# Patient Record
Sex: Male | Born: 1945
Health system: Southern US, Community
[De-identification: ages and names within clinical notes are randomized; demographics above are authoritative.]

## PROBLEM LIST (undated history)

## (undated) ENCOUNTER — Emergency Department: Discharge status: Absent without leave | Payer: PPO

## (undated) DIAGNOSIS — E785 Hyperlipidemia, unspecified: Secondary | ICD-10-CM

## (undated) DIAGNOSIS — N400 Enlarged prostate without lower urinary tract symptoms: Secondary | ICD-10-CM

## (undated) DIAGNOSIS — R339 Retention of urine, unspecified: Secondary | ICD-10-CM

## (undated) DIAGNOSIS — E7800 Pure hypercholesterolemia, unspecified: Secondary | ICD-10-CM

## (undated) DIAGNOSIS — C187 Malignant neoplasm of sigmoid colon: Secondary | ICD-10-CM

## (undated) DIAGNOSIS — Z8042 Family history of malignant neoplasm of prostate: Secondary | ICD-10-CM

## (undated) DIAGNOSIS — N3943 Post-void dribbling: Secondary | ICD-10-CM

## (undated) DIAGNOSIS — N401 Enlarged prostate with lower urinary tract symptoms: Secondary | ICD-10-CM

## (undated) DIAGNOSIS — C801 Malignant (primary) neoplasm, unspecified: Secondary | ICD-10-CM

## (undated) DIAGNOSIS — Z8739 Personal history of other diseases of the musculoskeletal system and connective tissue: Secondary | ICD-10-CM

## (undated) DIAGNOSIS — Z85038 Personal history of other malignant neoplasm of large intestine: Secondary | ICD-10-CM

## (undated) HISTORY — DX: Retention of urine, unspecified: R33.9

## (undated) HISTORY — PX: EYE SURGERY: SHX253

## (undated) HISTORY — DX: Malignant neoplasm of sigmoid colon: C18.7

## (undated) HISTORY — DX: Pure hypercholesterolemia, unspecified: E78.00

## (undated) HISTORY — DX: Benign prostatic hyperplasia with lower urinary tract symptoms: N40.1

## (undated) HISTORY — DX: Personal history of other diseases of the musculoskeletal system and connective tissue: Z87.39

## (undated) HISTORY — DX: Post-void dribbling: N39.43

## (undated) HISTORY — DX: Family history of malignant neoplasm of prostate: Z80.42

---

## 1992-10-15 HISTORY — PX: BACK SURGERY: SHX140

## 1994-10-15 HISTORY — PX: BACK SURGERY: SHX140

## 2009-10-15 DIAGNOSIS — C4492 Squamous cell carcinoma of skin, unspecified: Secondary | ICD-10-CM

## 2009-10-15 HISTORY — DX: Squamous cell carcinoma of skin, unspecified: C44.92

## 2012-12-01 ENCOUNTER — Ambulatory Visit: Payer: Self-pay | Admitting: Ophthalmology

## 2013-01-20 ENCOUNTER — Ambulatory Visit: Payer: Self-pay | Admitting: Ophthalmology

## 2013-04-15 DIAGNOSIS — N401 Enlarged prostate with lower urinary tract symptoms: Secondary | ICD-10-CM | POA: Insufficient documentation

## 2013-04-15 DIAGNOSIS — R339 Retention of urine, unspecified: Secondary | ICD-10-CM

## 2013-04-15 DIAGNOSIS — N3943 Post-void dribbling: Secondary | ICD-10-CM

## 2013-04-15 DIAGNOSIS — Z8042 Family history of malignant neoplasm of prostate: Secondary | ICD-10-CM

## 2013-04-15 HISTORY — DX: Benign prostatic hyperplasia with lower urinary tract symptoms: N40.1

## 2013-04-15 HISTORY — DX: Post-void dribbling: N39.43

## 2013-04-15 HISTORY — DX: Family history of malignant neoplasm of prostate: Z80.42

## 2013-04-15 HISTORY — DX: Retention of urine, unspecified: R33.9

## 2013-09-30 ENCOUNTER — Ambulatory Visit: Payer: Self-pay | Admitting: Anesthesiology

## 2013-10-06 ENCOUNTER — Ambulatory Visit: Payer: Self-pay | Admitting: Surgery

## 2013-10-15 HISTORY — PX: HERNIA REPAIR: SHX51

## 2014-09-08 DIAGNOSIS — E7800 Pure hypercholesterolemia, unspecified: Secondary | ICD-10-CM | POA: Insufficient documentation

## 2014-09-08 HISTORY — DX: Pure hypercholesterolemia, unspecified: E78.00

## 2015-02-04 NOTE — Op Note (Signed)
  DATE OF BIRTH:  Jun 09, 1946  DATE OF PROCEDURE:  12/01/2012  PREOPERATIVE DIAGNOSIS: Visually significant cataract of the left eye.   POSTOPERATIVE DIAGNOSIS: Visually significant cataract of the left eye.   OPERATIVE PROCEDURE: Cataract extraction by phacoemulsification with implant of intraocular lens to left eye.   SURGEON: Birder Robson, MD.   ANESTHESIA:  1. Managed anesthesia care.  2. Topical tetracaine drops followed by 2% Xylocaine jelly applied in the preoperative holding area.   COMPLICATIONS: None.   TECHNIQUE:  Stop-and-chop. DESCRIPTION OF PROCEDURE: The patient was examined and consented in the preoperative holding area where the aforementioned topical anesthesia was applied to the left eye and then brought back to the Operating Room where the left eye was prepped and draped in the usual sterile ophthalmic fashion and a lid speculum was placed. A paracentesis was created with the side port blade and the anterior chamber was filled with viscoelastic. A near clear corneal incision was performed with the steel keratome. A continuous curvilinear capsulorrhexis was performed with a cystotome followed by the capsulorrhexis forceps. Hydrodissection and hydrodelineation were carried out with BSS on a blunt cannula. The lens was removed in a stop-and-chop technique and the remaining cortical material was removed with the irrigation-aspiration handpiece. The capsular bag was inflated with viscoelastic and the Technis ZCB00, 22.0-diopter lens, serial number [id number] was placed in the capsular bag without complication. The remaining viscoelastic was removed from the eye with the irrigation-aspiration handpiece. The wounds were hydrated. The anterior chamber was flushed with Miostat and the eye was inflated to physiologic pressure. 0.1 mL of cefuroxime concentration 10 mg/mL was placed in the anterior chamber. The wounds were found to be water tight. The eye was dressed with Vigamox. The  patient was given protective glasses to wear throughout the day and a shield with which to sleep tonight. The patient was also given drops with which to begin a drop regimen today and will follow up with me in one day.       ____________________________ Livingston Diones. Madeline Pho, MD wlp:mr D: 12/01/2012 18:11:00 ET T: 12/01/2012 20:30:51 ET JOB#: [id number]  cc: Khamani Daniely L. Jillayne Witte, MD, <Dictator> Livingston Diones Tregan Read MD ELECTRONICALLY SIGNED 12/08/2012 11:54

## 2015-02-04 NOTE — Op Note (Signed)
PATIENT NAME:  Darin Bell, Darin Bell MR#:  [id number] DATE OF BIRTH:  09/29/1946  DATE OF PROCEDURE:  01/20/2013  PREOPERATIVE DIAGNOSIS: Visually significant cataract of the right eye.   POSTOPERATIVE DIAGNOSIS: Visually significant cataract of the right eye.   OPERATIVE PROCEDURE: Cataract extraction by phacoemulsification with implant of intraocular lens to the right eye.   SURGEON: Birder Robson, MD  ANESTHESIA:  1. Managed anesthesia care.  2. 50-50 mixture of 0.75% bupivacaine and 4% Xylocaine given as a retrobulbar block.   COMPLICATIONS: None.   TECHNIQUE:  Stop and chop.  DESCRIPTION OF PROCEDURE: The patient was examined and consented for this procedure in the preoperative holding area and then brought back to the Operating Room where the anesthesia team employed managed anesthesia care.  3.5 milliliters of the aforementioned mixture were placed in the right orbit on an Atkinson needle without complication. The right eye was then prepped and draped in the usual sterile ophthalmic fashion. A lid speculum was placed. The side-port blade was used to create a paracentesis and the anterior chamber was filled with viscoelastic. The keratome was used to create a near clear corneal incision. The continuous curvilinear capsulorrhexis was performed with a cystotome followed by the capsulorrhexis forceps. Hydrodissection and hydrodelineation were carried out with BSS on a blunt cannula. The lens was removed in a stop and chop technique. The remaining cortical material was removed with the irrigation-aspiration handpiece. The capsular bag was inflated with viscoelastic and the Tecnis ZCB00 22.5-diopter lens, serial number [id number] was placed in the capsular bag without complication. The remaining viscoelastic was removed from the eye with the irrigation-aspiration handpiece. The wounds were hydrated. The anterior chamber was flushed with Miostat and the eye was inflated to a physiologic pressure. 0.1 mL  of cefuroxime concentration 10 mg/mL was placed in the anterior chamber. The wounds were found to be water tight. The eye was dressed with Vigamox followed by Maxitrol ointment and a protective shield was placed. The patient will followup with me in one day.    ____________________________ Livingston Diones. Daniyah Fohl, MD wlp:ct D: 01/20/2013 12:36:29 ET T: 01/20/2013 13:13:27 ET JOB#: [id number]  cc: Micai Apolinar L. Leianna Barga, MD, <Dictator> Livingston Diones Dekota Kirlin MD ELECTRONICALLY SIGNED 01/27/2013 12:50

## 2015-02-04 NOTE — Op Note (Signed)
PATIENT NAME:  Darin Bell, Darin Bell MR#:  [id number] DATE OF BIRTH:  01-Aug-1946  DATE OF PROCEDURE:  10/06/2013  PREOPERATIVE DIAGNOSIS: Right inguinal hernia.   POSTOPERATIVE DIAGNOSIS: Right inguinal hernia.   PROCEDURE: Right inguinal hernia repair.   SURGEON: Rochel Brome, MD  ANESTHESIA: General.   INDICATION: This 69 year old male has a history of bulging in the right groin, also will have some discomfort at this site with burning. A right inguinal hernia was demonstrated on physical exam and repair was recommended for definitive treatment.   DESCRIPTION OF PROCEDURE: The patient was placed on the operating table in the supine position under general anesthesia. The right groin was clipped and prepared with ChloraPrep solution and draped in a sterile manner.   A right lower quadrant transversely oriented suprapubic incision was made, carried down through subcutaneous tissues. Several small bleeding points were cauterized. Scarpa's fascia was incised. The external oblique aponeurosis was identified. The external ring was demonstrated. An incision was made in the external oblique aponeurosis along the course of its fibers to open the external ring and expose the inguinal cord structures. The cord structures were mobilized. There was a direct inguinal hernia with a bulge of approximately 3 cm. Cremaster fibers were spread to expose a cord lipoma which was dissected free from surrounding structures and followed up into the internal ring. The cord lipoma was approximately 5 cm in length. A high ligation of this was done with a 4-0 Vicryl suture ligature and the lipoma was amputated. Next, circumferential dissection was made in the attenuated transversalis fascia surrounding the direct inguinal hernia and this attenuated transversalis fascia was removed. None of the tissues removed in this case needed to be sent for pathology. Next, with a Penrose drain around the cord structures for traction, the repair was  carried out with a row of 0 Surgilon sutures suturing the inguinal ligament to the conjoined tendon incorporating cut edges of the transversalis fascia into the repair. The last stitch led to satisfactory narrowing of the internal ring. Next, an onlay Bard soft polypropylene mesh was selected and cut to create an oval shape of some 2.5 x 3.5 cm with a notch cut out to straddle the cord structures. This was placed along the floor of the inguinal canal and sutured to the repair with 0 Surgilon and also was sutured medially to the fascia and the tails were sutured to the fascia on both sides of the internal ring. The repair looked good. Hemostasis was intact. Cord structures were replaced along the floor of the inguinal canal. Cut edges of the external oblique aponeurosis were closed with a running 4-0 Vicryl. The deep fascia superior and lateral to the repair site was infiltrated with 0.5% Sensorcaine with epinephrine. Subcutaneous tissues were infiltrated using a total of 20 mL. Next, the Scarpa's fascia was closed with interrupted 4-0 Vicryl. The skin was closed with running 4-0 Monocryl subcuticular suture and Dermabond. The testicle remained in the scrotum. The patient tolerated the procedure satisfactorily and was then prepared for transfer to the recovery room.  ____________________________ Lenna Sciara. Rochel Brome, MD jws:sb D: 10/06/2013 09:04:07 ET T: 10/06/2013 09:44:53 ET JOB#: [id number]  cc: Loreli Dollar, MD, <Dictator> Loreli Dollar MD ELECTRONICALLY SIGNED 10/07/2013 17:54

## 2016-03-09 DIAGNOSIS — H1851 Endothelial corneal dystrophy: Secondary | ICD-10-CM | POA: Diagnosis not present

## 2016-03-13 DIAGNOSIS — H1851 Endothelial corneal dystrophy: Secondary | ICD-10-CM | POA: Diagnosis not present

## 2016-07-20 ENCOUNTER — Ambulatory Visit (INDEPENDENT_AMBULATORY_CARE_PROVIDER_SITE_OTHER): Payer: PPO | Admitting: Urology

## 2016-07-20 ENCOUNTER — Encounter: Payer: Self-pay | Admitting: Urology

## 2016-07-20 ENCOUNTER — Ambulatory Visit: Payer: Self-pay | Admitting: Urology

## 2016-07-20 VITALS — BP 147/84 | HR 73 | Ht 71.0 in | Wt 180.0 lb

## 2016-07-20 DIAGNOSIS — Z87898 Personal history of other specified conditions: Secondary | ICD-10-CM

## 2016-07-20 DIAGNOSIS — N401 Enlarged prostate with lower urinary tract symptoms: Secondary | ICD-10-CM | POA: Diagnosis not present

## 2016-07-20 DIAGNOSIS — N138 Other obstructive and reflux uropathy: Secondary | ICD-10-CM

## 2016-07-20 DIAGNOSIS — Z8739 Personal history of other diseases of the musculoskeletal system and connective tissue: Secondary | ICD-10-CM | POA: Insufficient documentation

## 2016-07-20 DIAGNOSIS — L578 Other skin changes due to chronic exposure to nonionizing radiation: Secondary | ICD-10-CM | POA: Insufficient documentation

## 2016-07-20 HISTORY — DX: Personal history of other diseases of the musculoskeletal system and connective tissue: Z87.39

## 2016-07-20 LAB — URINALYSIS, COMPLETE
BILIRUBIN UA: NEGATIVE
Glucose, UA: NEGATIVE
KETONES UA: NEGATIVE
Leukocytes, UA: NEGATIVE
NITRITE UA: NEGATIVE
Protein, UA: NEGATIVE
RBC UA: NEGATIVE
SPEC GRAV UA: 1.025 (ref 1.005–1.030)
UUROB: 1 mg/dL (ref 0.2–1.0)
pH, UA: 5.5 (ref 5.0–7.5)

## 2016-07-20 LAB — MICROSCOPIC EXAMINATION
Bacteria, UA: NONE SEEN
Epithelial Cells (non renal): NONE SEEN /hpf (ref 0–10)
WBC UA: NONE SEEN /HPF (ref 0–?)

## 2016-07-20 LAB — BLADDER SCAN AMB NON-IMAGING

## 2016-07-20 NOTE — Progress Notes (Signed)
07/20/2016 11:49 AM   Darin Bell 10/22/1945 [id number]  Referring provider: Sherrin Daisy, MD [place], [place] [address]  Chief Complaint  Patient presents with  . Benign Prostatic Hypertrophy    New Patient    HPI: 70 yo M who is referred today for further evaluation of urinary symptoms.    He reports that over tha last 7-8 years, he has difficulty starting his urinary stream.  His stream is very weak and he feels like he not able to fully empty the bladder.  Nocturia x 2.   Minimal daytime frequency or urgency.  No incontinence.    He reports that he has always had a "bashful kidneys" even as a child when trying to void in a public restroom.    His urinary stream does spray.   He has had Foley catheters in the past for post op urinary retention in mid 90s x 2.   No issues with UTIs or gross hematuria.    He has seen Darin Bell in 2014.  He was also given alfuzosin and tamsulosin which made him dizzy.  He also took dutasteride for over 6 months without any effect.    He drinks only 1 cup of coffee.    Most recent PSA 1.09 on 08/2015.  He does have a family history of prostate cancer (fatheter).    PVR today 11 cc.        IPSS    Row Name 07/20/16 1000         International Prostate Symptom Score   How often have you had the sensation of not emptying your bladder? Less than half the time     How often have you had to urinate less than every two hours? Less than half the time     How often have you found you stopped and started again several times when you urinated? Less than half the time     How often have you found it difficult to postpone urination? Less than 1 in 5 times     How often have you had a weak urinary stream? Almost always     How often have you had to strain to start urination? Less than half the time     How many times did you typically get up at night to urinate? 3 Times     Total IPSS Score 17       Quality of Life due to urinary  symptoms   If you were to spend the rest of your life with your urinary condition just the way it is now how would you feel about that? Unhappy        Score:  1-7 Mild 8-19 Moderate 20-35 Severe  PMH: Past Medical History:  Diagnosis Date  . Benign localized hyperplasia of prostate with urinary obstruction and other lower urinary tract symptoms (LUTS) 04/15/2013  . Enlarged prostate with lower urinary tract symptoms (LUTS) 04/15/2013  . Family history of prostate cancer 04/15/2013  . History of degenerative disc disease 07/20/2016   Overview:  Status post C-spine surgery x 2, last in 1996  . Incomplete emptying of bladder 04/15/2013  . Post-void dribbling 04/15/2013  . Pure hypercholesterolemia 09/08/2014  . Retention of urine 04/15/2013    Surgical History: Past Surgical History:  Procedure Laterality Date  . BACK SURGERY  1994  . HERNIA REPAIR  2015    Home Medications:    Medication List    as of 07/20/2016 11:49  AM   You have not been prescribed any medications.     Allergies:  Allergies  Allergen Reactions  . Oxycodone-Acetaminophen     Other reaction(s): Unknown Constipation, Retention per patient  . Alfuzosin     Other reaction(s): Dizziness  . Dutasteride     Other reaction(s): Other (See Comments) intolerant  . Tamsulosin     Other reaction(s): Dizziness    Family History: Family History  Problem Relation Age of Onset  . Prostate cancer Father   . Kidney cancer Neg Hx     Social History:  reports that he has never smoked. He has never used smokeless tobacco. He reports that he does not drink alcohol or use drugs.  ROS: UROLOGY Frequent Urination?: Yes Hard to postpone urination?: No Burning/pain with urination?: No Get up at night to urinate?: Yes Leakage of urine?: No Urine stream starts and stops?: Yes Trouble starting stream?: Yes Do you have to strain to urinate?: No Blood in urine?: No Urinary tract infection?: No Sexually transmitted  disease?: No Injury to kidneys or bladder?: No Painful intercourse?: No Weak stream?: No Erection problems?: No Penile pain?: No  Gastrointestinal Nausea?: No Vomiting?: No Indigestion/heartburn?: Yes Diarrhea?: No Constipation?: No  Constitutional Fever: No Night sweats?: No Weight loss?: No Fatigue?: No  Skin Skin rash/lesions?: No Itching?: No  Eyes Blurred vision?: Yes Double vision?: No  Ears/Nose/Throat Sore throat?: No Sinus problems?: No  Hematologic/Lymphatic Swollen glands?: No Easy bruising?: Yes  Cardiovascular Leg swelling?: No Chest pain?: No  Respiratory Cough?: No Shortness of breath?: No  Endocrine Excessive thirst?: No  Musculoskeletal Back pain?: No Joint pain?: No  Neurological Headaches?: No Dizziness?: No  Psychologic Depression?: No Anxiety?: No  Physical Exam: BP (!) 147/84   Pulse 73   Ht 5\' 11"  (1.803 m)   Wt 180 lb (81.6 kg)   BMI 25.10 kg/m   Constitutional:  Alert and oriented, No acute distress. HEENT: Darin Bell AT, moist mucus membranes.  Trachea midline, no masses. Cardiovascular: No clubbing, cyanosis, or edema. Respiratory: Normal respiratory effort, no increased work of breathing. GI: Abdomen is soft, nontender, nondistended, no abdominal masses GU: No CVA tenderness. Normal circumcised phallus with orthotopic widely patent meatus. Bilateral descended testicles with no masses, nontender. Rectal: Normal sphincter tone. 40-50 cc prostate, no nodules, nontender. Skin: No rashes, bruises or suspicious lesions. Lymph: No cervical or inguinal adenopathy. Neurologic: Grossly intact, no focal deficits, moving all 4 extremities. Psychiatric: Normal mood and affect.  Laboratory Data: Comprehensive Metabolic Panel (CMP) (16/07/9603 7:30 AM) Comprehensive Metabolic Panel (CMP) (54/06/8118 7:30 AM)  Component Value Ref Range  Glucose 97 70 - 110 mg/dL  Sodium 140 136 - 145 mmol/L  Potassium 4.3 3.6 - 5.1 mmol/L    Chloride 107 97 - 109 mmol/L  Carbon Dioxide (CO2) 25.8 22.0 - 32.0 mmol/L  Urea Nitrogen (BUN) 20 7 - 25 mg/dL  Creatinine 0.9 0.7 - 1.3 mg/dL   PSA, Total (Screen) (09/05/2015 7:30 AM) PSA, Total (Screen) (09/05/2015 7:30 AM)  Component Value Ref Range  PSA (Prostate Specific Antigen), Total 1.08 0.10 - 4.00 ng/mL    Urinalysis Results for orders placed or performed in visit on 07/20/16  Microscopic Examination  Result Value Ref Range   WBC, UA None seen 0 - 5 /hpf   RBC, UA 0-2 0 - 2 /hpf   Epithelial Cells (non renal) None seen 0 - 10 /hpf   Bacteria, UA None seen None seen/Few  Urinalysis, Complete  Result Value Ref Range  Specific Gravity, UA 1.025 1.005 - 1.030   pH, UA 5.5 5.0 - 7.5   Color, UA Yellow Yellow   Appearance Ur Clear Clear   Leukocytes, UA Negative Negative   Protein, UA Negative Negative/Trace   Glucose, UA Negative Negative   Ketones, UA Negative Negative   RBC, UA Negative Negative   Bilirubin, UA Negative Negative   Urobilinogen, Ur 1.0 0.2 - 1.0 mg/dL   Nitrite, UA Negative Negative   Microscopic Examination See below:   BLADDER SCAN AMB NON-IMAGING  Result Value Ref Range   Scan Result 30ml     Pertinent Imaging: Results for orders placed or performed in visit on 07/20/16  BLADDER SCAN AMB NON-IMAGING  Result Value Ref Range   Scan Result 16ml      Assessment & Plan:  70 year old male with BPH symptoms, primarily obstructive who has failed medical management. He is unable to tolerate off of blockers and had no benefit prolong course of 5-ARIs.    I suspect at this point he will need an outlet procedure. His prostate is enlarged but not dramatically so on rectal exam today. I would like to further evaluate him with a uroflow to assess for obstructive voiding pattern and cystoscopy to rule out stricture and also to assess the anatomy of his prostate.  No concern for clinically signficant prostate cancer, normal PSA in the past year and  unremarkable rectal exam today.  We briefly discussed possible treatment modalities for outlet obstruction including TURP, holmium laser enucleation of the prostate, ablation of the prostate amongst others. His questions were answered.  1. BPH with obstruction/lower urinary tract symptoms - Urinalysis, Complete - BLADDER SCAN AMB NON-IMAGING  2. History of urinary retention   Return in about 2 weeks (around 08/03/2016) for uroflow/ cysto.  Hollice Espy, MD  [facility] [address] [place], [place] [address] [phone]

## 2016-08-03 ENCOUNTER — Ambulatory Visit (INDEPENDENT_AMBULATORY_CARE_PROVIDER_SITE_OTHER): Payer: PPO | Admitting: Urology

## 2016-08-03 VITALS — BP 156/84 | HR 72 | Ht 70.0 in | Wt 180.0 lb

## 2016-08-03 DIAGNOSIS — Z87898 Personal history of other specified conditions: Secondary | ICD-10-CM

## 2016-08-03 DIAGNOSIS — N401 Enlarged prostate with lower urinary tract symptoms: Secondary | ICD-10-CM | POA: Diagnosis not present

## 2016-08-03 DIAGNOSIS — N138 Other obstructive and reflux uropathy: Secondary | ICD-10-CM

## 2016-08-03 LAB — MICROSCOPIC EXAMINATION
Bacteria, UA: NONE SEEN
Epithelial Cells (non renal): NONE SEEN /hpf (ref 0–10)
RBC, UA: NONE SEEN /hpf (ref 0–?)

## 2016-08-03 LAB — URINALYSIS, COMPLETE
Bilirubin, UA: NEGATIVE
GLUCOSE, UA: NEGATIVE
KETONES UA: NEGATIVE
LEUKOCYTES UA: NEGATIVE
Nitrite, UA: NEGATIVE
Protein, UA: NEGATIVE
RBC, UA: NEGATIVE
SPEC GRAV UA: 1.02 (ref 1.005–1.030)
Urobilinogen, Ur: 1 mg/dL (ref 0.2–1.0)
pH, UA: 7 (ref 5.0–7.5)

## 2016-08-03 MED ORDER — CIPROFLOXACIN HCL 500 MG PO TABS
500.0000 mg | ORAL_TABLET | Freq: Once | ORAL | Status: AC
  Administered 2016-08-03: 500 mg via ORAL

## 2016-08-03 MED ORDER — LIDOCAINE HCL 2 % EX GEL
1.0000 "application " | Freq: Once | CUTANEOUS | Status: AC
  Administered 2016-08-03: 1 via URETHRAL

## 2016-08-03 NOTE — Progress Notes (Signed)
Uroflow  Peak Flow: 7.72ml Average Flow: 5.86ml Voided Volume: 238.72ml Voiding Time: 42.8sec Flow Time: 41.7sec Time to Peak Flow: 7.4sec

## 2016-08-03 NOTE — Progress Notes (Signed)
08/03/2016 12:02 PM   Darin Bell [birth year] [id number]  Referring provider: Sherrin Daisy, MD [place], [place] [address]  Chief Complaint  Patient presents with  . Cysto    HPI: 70 yo M who Hurt for refractory urinary symptoms who presents today for cystoscopy to evaluate his prostatic and bladder anatomy.    He reports that over tha last 7-8 years, he has difficulty starting his urinary stream.  His stream is very weak and he feels like he not able to fully empty the bladder.  Nocturia x 2.   Minimal daytime frequency or urgency.  No incontinence.    He reports that he has always had a "bashful kidneys" even as a child when trying to void in a public restroom.    His urinary stream does spray.   He has had Foley catheters in the past for post op urinary retention in mid 90s x 2.   No issues with UTIs or gross hematuria.    He has seen Dr. Jacqlyn Larsen in 2014.  He was also given alfuzosin and tamsulosin which made him dizzy.  He also took dutasteride for over 6 months without any effect.    He drinks only 1 cup of coffee.    Most recent PSA 1.09 on 08/2015.  He does have a family history of prostate cancer (fatheter).  Rectal exam last visit approximate 40 cc, no masses.  PVR has been minimal.      IPSS    Row Name 07/20/16 1000         International Prostate Symptom Score   How often have you had the sensation of not emptying your bladder? Less than half the time     How often have you had to urinate less than every two hours? Less than half the time     How often have you found you stopped and started again several times when you urinated? Less than half the time     How often have you found it difficult to postpone urination? Less than 1 in 5 times     How often have you had a weak urinary stream? Almost always     How often have you had to strain to start urination? Less than half the time     How many times did you typically get up at night to urinate? 3  Times     Total IPSS Score 17       Quality of Life due to urinary symptoms   If you were to spend the rest of your life with your urinary condition just the way it is now how would you feel about that? Unhappy        Score:  1-7 Mild 8-19 Moderate 20-35 Severe  PMH: Past Medical History:  Diagnosis Date  . Benign localized hyperplasia of prostate with urinary obstruction and other lower urinary tract symptoms (LUTS) 04/15/2013  . Enlarged prostate with lower urinary tract symptoms (LUTS) 04/15/2013  . Family history of prostate cancer 04/15/2013  . History of degenerative disc disease 07/20/2016   Overview:  Status post C-spine surgery x 2, last in 1996  . Incomplete emptying of bladder 04/15/2013  . Post-void dribbling 04/15/2013  . Pure hypercholesterolemia 09/08/2014  . Retention of urine 04/15/2013    Surgical History: Past Surgical History:  Procedure Laterality Date  . BACK SURGERY  1994  . HERNIA REPAIR  2015    Home Medications:    Medication List  as of 08/03/2016 12:02 PM   You have not been prescribed any medications.     Allergies:  Allergies  Allergen Reactions  . Oxycodone-Acetaminophen     Other reaction(s): Unknown Constipation, Retention per patient  . Alfuzosin     Other reaction(s): Dizziness  . Dutasteride     Other reaction(s): Other (See Comments) intolerant  . Tamsulosin     Other reaction(s): Dizziness    Family History: Family History  Problem Relation Age of Onset  . Prostate cancer Father   . Kidney cancer Neg Hx     Social History:  reports that he has never smoked. He has never used smokeless tobacco. He reports that he does not drink alcohol or use drugs.  Physical Exam: BP (!) 156/84   Pulse 72   Ht 5\' 10"  (1.778 m)   Wt 180 lb (81.6 kg)   BMI 25.83 kg/m   Constitutional:  Alert and oriented, No acute distress. HEENT: [place] AT, moist mucus membranes.  Trachea midline, no masses. RRR. Cardiovascular: No clubbing, cyanosis,  or edema. CTAB. Respiratory: Normal respiratory effort, no increased work of breathing. GI: Abdomen is soft, nontender, nondistended, no abdominal masses GU: No CVA tenderness. Normal circumcised phallus with orthotopic widely patent meatus. Skin: No rashes, bruises or suspicious lesions. Neurologic: Grossly intact, no focal deficits, moving all 4 extremities. Psychiatric: Normal mood and affect.  Laboratory Data: Comprehensive Metabolic Panel (CMP) (76/22/6333 7:30 AM) Comprehensive Metabolic Panel (CMP) (54/56/2563 7:30 AM)  Component Value Ref Range  Glucose 97 70 - 110 mg/dL  Sodium 140 136 - 145 mmol/L  Potassium 4.3 3.6 - 5.1 mmol/L  Chloride 107 97 - 109 mmol/L  Carbon Dioxide (CO2) 25.8 22.0 - 32.0 mmol/L  Urea Nitrogen (BUN) 20 7 - 25 mg/dL  Creatinine 0.9 0.7 - 1.3 mg/dL   PSA, Total (Screen) (09/05/2015 7:30 AM) PSA, Total (Screen) (09/05/2015 7:30 AM)  Component Value Ref Range  PSA (Prostate Specific Antigen), Total 1.08 0.10 - 4.00 ng/mL    Urinalysis Results for orders placed or performed in visit on 08/03/16  Microscopic Examination  Result Value Ref Range   WBC, UA 0-5 0 - 5 /hpf   RBC, UA None seen 0 - 2 /hpf   Epithelial Cells (non renal) None seen 0 - 10 /hpf   Bacteria, UA None seen None seen/Few  Urinalysis, Complete  Result Value Ref Range   Specific Gravity, UA 1.020 1.005 - 1.030   pH, UA 7.0 5.0 - 7.5   Color, UA Yellow Yellow   Appearance Ur Clear Clear   Leukocytes, UA Negative Negative   Protein, UA Negative Negative/Trace   Glucose, UA Negative Negative   Ketones, UA Negative Negative   RBC, UA Negative Negative   Bilirubin, UA Negative Negative   Urobilinogen, Ur 1.0 0.2 - 1.0 mg/dL   Nitrite, UA Negative Negative   Microscopic Examination See below:     Uroflow today reviewed. This reveals peak flow of 7 mL's per second, voided volume approximate 230 cc with flat drawnout curve consistent with obstruction.  Cystoscopy Procedure  Note  Patient identification was confirmed, informed consent was obtained, and patient was prepped using Betadine solution.  Lidocaine jelly was administered per urethral meatus.    Preoperative abx where received prior to procedure.     Pre-Procedure: - Inspection reveals a normal caliber ureteral meatus.  Procedure: The flexible cystoscope was introduced without difficulty - No urethral strictures/lesions are present. - Enlarged prostate with bilobar coaptation, 4 cm  prostatic length - Mildly elevated bladder neck - Bilateral ureteral orifices identified - Bladder mucosa  reveals no ulcers, tumors, or lesions - No bladder stones - Mild trabeculation of bladder when descended  Retroflexion shows concentric intravesical protrusion of prostate, mild   Post-Procedure: - Patient tolerated the procedure well  Assessment/ Plan:  Assessment & Plan:  70 year old male with BPH symptoms, primarily obstructive who has failed medical management. He is unable to tolerate off of blockers and had no benefit prolong course of 5-ARIs.    Cystoscopy today reveals a 4 cm prostate with bilobar coaptation but no obvious median lobe although there is some intravesical protrusion. He does have an obstructive voiding pattern with a low flow consistent with obstruction.  I counseled him for consideration of an outlet procedure. Based on the size and shape of the gland, I feel that he would do well with a classic TURP versus laser ablation of the prostate. Recent benefits of each were discussed in detail. All of his questions were answered. He is most interested in TURP.  Risk including bleeding, infection, damage to surrounding structures including prostate, bladder, and bladder neck, bladder neck contracture, incontinence, worsening of his urgency frequency, retrograde ejaculation, amongst others were discussed. We also discussed his need for overnight hospitalization with CBI and Foley catheterization.  He understands all this and is willing to proceed.  1. BPH with obstruction/lower urinary tract symptoms - Urinalysis, Complete - Uroflow  2. History of urinary retention   Schedule TURP  Hollice Espy, MD  [facility] [address], [place], [place] [address] [phone]

## 2016-08-06 ENCOUNTER — Telehealth: Payer: Self-pay | Admitting: Radiology

## 2016-08-06 ENCOUNTER — Other Ambulatory Visit: Payer: Self-pay | Admitting: Radiology

## 2016-08-06 DIAGNOSIS — N138 Other obstructive and reflux uropathy: Secondary | ICD-10-CM

## 2016-08-06 DIAGNOSIS — N401 Enlarged prostate with lower urinary tract symptoms: Principal | ICD-10-CM

## 2016-08-06 NOTE — Telephone Encounter (Signed)
Notified pt of surgery scheduled with Dr Erlene Quan on 08/13/16, pre-admit testing appt on 08/08/16 @8 :30 & to call Friday prior to surgery for arrival time to SDS. Pt voices understanding.

## 2016-08-08 ENCOUNTER — Encounter
Admission: RE | Admit: 2016-08-08 | Discharge: 2016-08-08 | Disposition: A | Discharge status: Home / Self Care | Payer: PPO | Source: Ambulatory Visit | Attending: Urology | Admitting: Urology

## 2016-08-08 DIAGNOSIS — Z01818 Encounter for other preprocedural examination: Secondary | ICD-10-CM | POA: Insufficient documentation

## 2016-08-08 HISTORY — DX: Malignant (primary) neoplasm, unspecified: C80.1

## 2016-08-08 LAB — CBC
HCT: 43.5 % (ref 40.0–52.0)
Hemoglobin: 15.1 g/dL (ref 13.0–18.0)
MCH: 32.8 pg (ref 26.0–34.0)
MCHC: 34.8 g/dL (ref 32.0–36.0)
MCV: 94.4 fL (ref 80.0–100.0)
PLATELETS: 149 10*3/uL — AB (ref 150–440)
RBC: 4.61 MIL/uL (ref 4.40–5.90)
RDW: 13.1 % (ref 11.5–14.5)
WBC: 4.9 10*3/uL (ref 3.8–10.6)

## 2016-08-08 LAB — URINALYSIS COMPLETE WITH MICROSCOPIC (ARMC ONLY)
BILIRUBIN URINE: NEGATIVE
Bacteria, UA: NONE SEEN
GLUCOSE, UA: NEGATIVE mg/dL
Hgb urine dipstick: NEGATIVE
Ketones, ur: NEGATIVE mg/dL
Leukocytes, UA: NEGATIVE
Nitrite: NEGATIVE
Protein, ur: NEGATIVE mg/dL
SQUAMOUS EPITHELIAL / LPF: NONE SEEN
Specific Gravity, Urine: 1.014 (ref 1.005–1.030)
WBC, UA: NONE SEEN WBC/hpf (ref 0–5)
pH: 6 (ref 5.0–8.0)

## 2016-08-08 LAB — BASIC METABOLIC PANEL
Anion gap: 6 (ref 5–15)
BUN: 19 mg/dL (ref 6–20)
CHLORIDE: 104 mmol/L (ref 101–111)
CO2: 29 mmol/L (ref 22–32)
CREATININE: 0.88 mg/dL (ref 0.61–1.24)
Calcium: 9.8 mg/dL (ref 8.9–10.3)
GFR calc non Af Amer: >60 mL/min (ref >60)
GLUCOSE: 111 mg/dL — AB (ref 65–99)
Potassium: 3.9 mmol/L (ref 3.5–5.1)
Sodium: 139 mmol/L (ref 135–145)

## 2016-08-08 NOTE — Patient Instructions (Signed)
  Your procedure is scheduled [id number] 30, 2017 (Monday) Report to Same Day Surgery 2nd floor [facility] To find out your arrival time please call [phone] between 1PM - 3PM on August 10, 2016 (Friday)  Remember: Instructions that are not followed completely may result in serious medical risk, up to and including death, or upon the discretion of your surgeon and anesthesiologist your surgery may need to be rescheduled.    _x___ 1. Do not eat food or drink liquids after midnight. No gum chewing or hard candies.     __x__ 2. No Alcohol for 24 hours before or after surgery.   __x__3. No Smoking for 24 prior to surgery.   ____  4. Bring all medications with you on the day of surgery if instructed.    __x__ 5. Notify your doctor if there is any change in your medical condition     (cold, fever, infections).     Do not wear jewelry, make-up, hairpins, clips or nail polish.  Do not wear lotions, powders, or perfumes. You may wear deodorant.  Do not shave 48 hours prior to surgery. Men may shave face and neck.  Do not bring valuables to the hospital.    [facility] is not responsible for any belongings or valuables.               Contacts, dentures or bridgework may not be worn into surgery.  Leave your suitcase in the car. After surgery it may be brought to your room.  For patients admitted to the hospital, discharge time is determined by your treatment team.   Patients discharged the day of surgery will not be allowed to drive home.    Please read over the following fact sheets that you were given:   [facility] Preparing for Surgery and or MRSA Information   ___ Take these medicines the morning of surgery with A SIP OF WATER:    1.   2.  3.  4.  5.  6.  ____Fleets enema or Magnesium Citrate as directed.   ___ Use CHG Soap or sage wipes as directed on instruction sheet   ____ Use inhalers on the day of surgery and bring to hospital day of surgery  ____ Stop  metformin 2 days prior to surgery    ____ Take 1/2 of usual insulin dose the night before surgery and none on the morning of           surgery.   _x___ Stop aspirin or coumadin, or plavix (NO ASPIRIN)  x__ Stop Anti-inflammatories such as Advil, Aleve, Ibuprofen, Motrin, Naproxen,          Naprosyn, Goodies powders or aspirin products. Ok to take Tylenol.   ____ Stop supplements until after surgery.    ____ Bring C-Pap to the hospital.

## 2016-08-09 ENCOUNTER — Other Ambulatory Visit: Payer: Self-pay

## 2016-08-09 LAB — URINE CULTURE: CULTURE: NO GROWTH

## 2016-08-12 MED ORDER — CEFAZOLIN SODIUM-DEXTROSE 2-4 GM/100ML-% IV SOLN
2.0000 g | INTRAVENOUS | Status: AC
  Administered 2016-08-13: 2 g via INTRAVENOUS

## 2016-08-13 ENCOUNTER — Ambulatory Visit: Payer: PPO | Admitting: Anesthesiology

## 2016-08-13 ENCOUNTER — Encounter
Admission: RE | Disposition: A | Discharge status: Home / Self Care | Payer: Self-pay | Source: Ambulatory Visit | Attending: Urology

## 2016-08-13 ENCOUNTER — Observation Stay
Admission: RE | Admit: 2016-08-13 | Discharge: 2016-08-14 | Disposition: A | Discharge status: Home / Self Care | Payer: PPO | Source: Ambulatory Visit | Attending: Urology | Admitting: Urology

## 2016-08-13 ENCOUNTER — Encounter: Payer: Self-pay | Admitting: *Deleted

## 2016-08-13 DIAGNOSIS — N138 Other obstructive and reflux uropathy: Secondary | ICD-10-CM | POA: Diagnosis not present

## 2016-08-13 DIAGNOSIS — I10 Essential (primary) hypertension: Secondary | ICD-10-CM | POA: Insufficient documentation

## 2016-08-13 DIAGNOSIS — E119 Type 2 diabetes mellitus without complications: Secondary | ICD-10-CM | POA: Insufficient documentation

## 2016-08-13 DIAGNOSIS — N401 Enlarged prostate with lower urinary tract symptoms: Secondary | ICD-10-CM | POA: Diagnosis not present

## 2016-08-13 DIAGNOSIS — R338 Other retention of urine: Secondary | ICD-10-CM | POA: Insufficient documentation

## 2016-08-13 DIAGNOSIS — Z885 Allergy status to narcotic agent status: Secondary | ICD-10-CM | POA: Diagnosis not present

## 2016-08-13 DIAGNOSIS — Z8042 Family history of malignant neoplasm of prostate: Secondary | ICD-10-CM | POA: Diagnosis not present

## 2016-08-13 DIAGNOSIS — E7800 Pure hypercholesterolemia, unspecified: Secondary | ICD-10-CM | POA: Insufficient documentation

## 2016-08-13 DIAGNOSIS — Z888 Allergy status to other drugs, medicaments and biological substances status: Secondary | ICD-10-CM | POA: Diagnosis not present

## 2016-08-13 DIAGNOSIS — N411 Chronic prostatitis: Secondary | ICD-10-CM | POA: Insufficient documentation

## 2016-08-13 HISTORY — PX: TRANSURETHRAL RESECTION OF PROSTATE: SHX73

## 2016-08-13 SURGERY — TURP (TRANSURETHRAL RESECTION OF PROSTATE)
Anesthesia: General | Wound class: Clean Contaminated

## 2016-08-13 MED ORDER — FENTANYL CITRATE (PF) 100 MCG/2ML IJ SOLN
INTRAMUSCULAR | Status: AC
  Filled 2016-08-13: qty 2

## 2016-08-13 MED ORDER — FENTANYL CITRATE (PF) 100 MCG/2ML IJ SOLN
INTRAMUSCULAR | Status: AC
  Administered 2016-08-13: 10:00:00
  Filled 2016-08-13: qty 2

## 2016-08-13 MED ORDER — ONDANSETRON HCL 4 MG/2ML IJ SOLN: 4.0000 mg | INTRAMUSCULAR | Status: DC | PRN

## 2016-08-13 MED ORDER — FAMOTIDINE 20 MG PO TABS
ORAL_TABLET | ORAL | Status: AC
  Administered 2016-08-13: 20 mg via ORAL
  Filled 2016-08-13: qty 1

## 2016-08-13 MED ORDER — ACETAMINOPHEN 325 MG PO TABS: 650.0000 mg | ORAL_TABLET | ORAL | Status: DC | PRN

## 2016-08-13 MED ORDER — CEFAZOLIN SODIUM-DEXTROSE 2-4 GM/100ML-% IV SOLN
INTRAVENOUS | Status: AC
Start: 2016-08-13 — End: 2016-08-13
  Administered 2016-08-13: 2 g via INTRAVENOUS
  Filled 2016-08-13: qty 100

## 2016-08-13 MED ORDER — DOCUSATE SODIUM 100 MG PO CAPS
100.0000 mg | ORAL_CAPSULE | Freq: Two times a day (BID) | ORAL | Status: DC
  Administered 2016-08-13 (×2): 100 mg via ORAL
  Filled 2016-08-13 (×2): qty 1

## 2016-08-13 MED ORDER — MORPHINE SULFATE (PF) 2 MG/ML IV SOLN: 2.0000 mg | INTRAVENOUS | Status: DC | PRN

## 2016-08-13 MED ORDER — BELLADONNA ALKALOIDS-OPIUM 16.2-60 MG RE SUPP: 1.0000 | Freq: Four times a day (QID) | RECTAL | Status: DC | PRN

## 2016-08-13 MED ORDER — CEFAZOLIN IN D5W 1 GM/50ML IV SOLN
1.0000 g | Freq: Three times a day (TID) | INTRAVENOUS | Status: AC
  Administered 2016-08-13 (×2): 1 g via INTRAVENOUS
  Filled 2016-08-13 (×2): qty 50

## 2016-08-13 MED ORDER — DOCUSATE SODIUM 100 MG PO CAPS: 100.0000 mg | ORAL_CAPSULE | Freq: Two times a day (BID) | ORAL | 0 refills | Status: DC

## 2016-08-13 MED ORDER — HYDROCODONE-ACETAMINOPHEN 5-325 MG PO TABS: 1.0000 | ORAL_TABLET | Freq: Four times a day (QID) | ORAL | 0 refills | Status: DC | PRN

## 2016-08-13 MED ORDER — DIPHENHYDRAMINE HCL 50 MG/ML IJ SOLN: 12.5000 mg | Freq: Four times a day (QID) | INTRAMUSCULAR | Status: DC | PRN

## 2016-08-13 MED ORDER — LIDOCAINE HCL (CARDIAC) 20 MG/ML IV SOLN
INTRAVENOUS | Status: DC | PRN
  Administered 2016-08-13: 50 mg via INTRAVENOUS

## 2016-08-13 MED ORDER — FENTANYL CITRATE (PF) 100 MCG/2ML IJ SOLN
25.0000 ug | INTRAMUSCULAR | Status: AC | PRN
  Administered 2016-08-13 (×6): 25 ug via INTRAVENOUS

## 2016-08-13 MED ORDER — FENTANYL CITRATE (PF) 100 MCG/2ML IJ SOLN
INTRAMUSCULAR | Status: DC | PRN
  Administered 2016-08-13 (×2): 50 ug via INTRAVENOUS

## 2016-08-13 MED ORDER — ONDANSETRON HCL 4 MG/2ML IJ SOLN
INTRAMUSCULAR | Status: DC | PRN
  Administered 2016-08-13 (×2): 4 mg via INTRAVENOUS

## 2016-08-13 MED ORDER — MIDAZOLAM HCL 2 MG/2ML IJ SOLN
INTRAMUSCULAR | Status: DC | PRN
  Administered 2016-08-13: 1 mg via INTRAVENOUS

## 2016-08-13 MED ORDER — DIPHENHYDRAMINE HCL 12.5 MG/5ML PO ELIX: 12.5000 mg | ORAL_SOLUTION | Freq: Four times a day (QID) | ORAL | Status: DC | PRN

## 2016-08-13 MED ORDER — PROPOFOL 500 MG/50ML IV EMUL
INTRAVENOUS | Status: DC | PRN
  Administered 2016-08-13: 25 ug/kg/min via INTRAVENOUS

## 2016-08-13 MED ORDER — ONDANSETRON HCL 4 MG/2ML IJ SOLN: 4.0000 mg | Freq: Once | INTRAMUSCULAR | Status: DC | PRN

## 2016-08-13 MED ORDER — HYDROCODONE-ACETAMINOPHEN 5-325 MG PO TABS
1.0000 | ORAL_TABLET | ORAL | Status: DC | PRN
  Administered 2016-08-13 (×2): 1 via ORAL
  Filled 2016-08-13: qty 1
  Filled 2016-08-13: qty 2

## 2016-08-13 MED ORDER — SODIUM CHLORIDE 0.9 % IV SOLN
INTRAVENOUS | Status: DC
  Administered 2016-08-13 – 2016-08-14 (×3): via INTRAVENOUS

## 2016-08-13 MED ORDER — DEXAMETHASONE SODIUM PHOSPHATE 10 MG/ML IJ SOLN
INTRAMUSCULAR | Status: DC | PRN
  Administered 2016-08-13: 10 mg via INTRAVENOUS

## 2016-08-13 MED ORDER — LACTATED RINGERS IV SOLN
INTRAVENOUS | Status: DC
  Administered 2016-08-13: 07:00:00 via INTRAVENOUS

## 2016-08-13 MED ORDER — FAMOTIDINE 20 MG PO TABS
20.0000 mg | ORAL_TABLET | Freq: Once | ORAL | Status: AC
  Administered 2016-08-13: 20 mg via ORAL

## 2016-08-13 MED ORDER — PROPOFOL 10 MG/ML IV BOLUS
INTRAVENOUS | Status: DC | PRN
  Administered 2016-08-13: 200 mg via INTRAVENOUS

## 2016-08-13 MED ORDER — HEPARIN SODIUM (PORCINE) 5000 UNIT/ML IJ SOLN
5000.0000 [IU] | Freq: Three times a day (TID) | INTRAMUSCULAR | Status: DC
  Administered 2016-08-13 – 2016-08-14 (×2): 5000 [IU] via SUBCUTANEOUS
  Filled 2016-08-13 (×2): qty 1

## 2016-08-13 MED ORDER — OXYBUTYNIN CHLORIDE 5 MG PO TABS
5.0000 mg | ORAL_TABLET | Freq: Three times a day (TID) | ORAL | Status: DC | PRN
  Administered 2016-08-13: 5 mg via ORAL
  Filled 2016-08-13: qty 1

## 2016-08-13 SURGICAL SUPPLY — 23 items
ADAPTER IRRIG TUBE 2 SPIKE SOL (ADAPTER) ×6 IMPLANT
BAG DRAIN CYSTO-URO LG1000N (MISCELLANEOUS) ×3 IMPLANT
BAG URO DRAIN 4000ML (MISCELLANEOUS) ×3 IMPLANT
CATH FOL 2WAY LX 24X30 (CATHETERS) ×3 IMPLANT
CATH FOL 3WAY LX 20X30 (CATHETERS) ×3 IMPLANT
CATH FOL LEG HOLDER (MISCELLANEOUS) ×3 IMPLANT
DRAPE UTILITY 15X26 TOWEL STRL (DRAPES) ×3 IMPLANT
ELECT LOOP 22F BIPOLAR SML (ELECTROSURGICAL) ×3
ELECTRODE LOOP 22F BIPOLAR SML (ELECTROSURGICAL) ×1 IMPLANT
GLOVE BIO SURGEON STRL SZ 6.5 (GLOVE) ×4 IMPLANT
GLOVE BIO SURGEONS STRL SZ 6.5 (GLOVE) ×2
GOWN STRL REUS W/ TWL LRG LVL3 (GOWN DISPOSABLE) ×2 IMPLANT
GOWN STRL REUS W/TWL LRG LVL3 (GOWN DISPOSABLE) ×4
KIT RM TURNOVER CYSTO AR (KITS) ×3 IMPLANT
LOOP CUT BIPOLAR 24F LRG (ELECTROSURGICAL) IMPLANT
PACK CYSTO AR (MISCELLANEOUS) ×3 IMPLANT
SET IRRIG Y TYPE TUR BLADDER L (SET/KITS/TRAYS/PACK) ×3 IMPLANT
SET IRRIGATING DISP (SET/KITS/TRAYS/PACK) ×3 IMPLANT
SOL .9 NS 3000ML IRR  AL (IV SOLUTION) ×12
SOL .9 NS 3000ML IRR UROMATIC (IV SOLUTION) ×6 IMPLANT
SYR TOOMEY 50ML (SYRINGE) ×3 IMPLANT
SYRINGE IRR TOOMEY STRL 70CC (SYRINGE) ×3 IMPLANT
WATER STERILE IRR 1000ML POUR (IV SOLUTION) ×3 IMPLANT

## 2016-08-13 NOTE — Interval H&P Note (Signed)
History and Physical Interval Note:  08/13/2016 7:27 AM  Darin Bell  has presented today for surgery, with the diagnosis of bph with luts/obstruction  The various methods of treatment have been discussed with the patient and family. After consideration of risks, benefits and other options for treatment, the patient has consented to  Procedure(s): TRANSURETHRAL RESECTION OF THE PROSTATE (TURP) (N/A) as a surgical intervention .  The patient's history has been reviewed, patient examined, no change in status, stable for surgery.  I have reviewed the patient's chart and labs.  Questions were answered to the patient's satisfaction.     Hollice Espy

## 2016-08-13 NOTE — Anesthesia Preprocedure Evaluation (Signed)
Anesthesia Evaluation  Patient identified by MRN, date of birth, ID band Patient awake    Reviewed: Allergy & Precautions, H&P , NPO status , Patient's Chart, lab work & pertinent test results, reviewed documented beta blocker date and time   Airway Mallampati: II  TM Distance: >3 FB Neck ROM: full    Dental no notable dental hx. (+) Teeth Intact   Pulmonary neg pulmonary ROS,    Pulmonary exam normal breath sounds clear to auscultation       Cardiovascular Exercise Tolerance: Good hypertension, negative cardio ROS Normal cardiovascular exam Rhythm:regular Rate:Normal     Neuro/Psych negative neurological ROS  negative psych ROS   GI/Hepatic negative GI ROS, Neg liver ROS,   Endo/Other  negative endocrine ROSdiabetes, Well Controlled  Renal/GU negative Renal ROS  negative genitourinary   Musculoskeletal   Abdominal   Peds  Hematology negative hematology ROS (+)   Anesthesia Other Findings   Reproductive/Obstetrics negative OB ROS                             Anesthesia Physical Anesthesia Plan  ASA: II  Anesthesia Plan: General LMA   Post-op Pain Management:    Induction:   Airway Management Planned:   Additional Equipment:   Intra-op Plan:   Post-operative Plan:   Informed Consent: I have reviewed the patients History and Physical, chart, labs and discussed the procedure including the risks, benefits and alternatives for the proposed anesthesia with the patient or authorized representative who has indicated his/her understanding and acceptance.     Plan Discussed with: CRNA  Anesthesia Plan Comments:         Anesthesia Quick Evaluation

## 2016-08-13 NOTE — Transfer of Care (Signed)
Immediate Anesthesia Transfer of Care Note  Patient: Darin Bell  Procedure(s) Performed: Procedure(s): TRANSURETHRAL RESECTION OF THE PROSTATE (TURP) (N/A)  Patient Location: PACU  Anesthesia Type:General  Level of Consciousness: responds to stimulation  Airway & Oxygen Therapy: Patient Spontanous Breathing and Patient connected to nasal cannula oxygen  Post-op Assessment: Report given to RN and Post -op Vital signs reviewed and stable  Post vital signs: Reviewed and stable  Last Vitals:  Vitals:   08/13/16 0640 08/13/16 0916  BP: 136/89 (!) 141/88  Pulse: 68 65  Resp: 20 13  Temp: 36.7 C 36.2 C    Last Pain:  Vitals:   08/13/16 0916  TempSrc:   PainSc: Asleep         Complications: No apparent anesthesia complications

## 2016-08-13 NOTE — Anesthesia Postprocedure Evaluation (Signed)
Anesthesia Post Note  Patient: Darin Bell  Procedure(s) Performed: Procedure(s) (LRB): TRANSURETHRAL RESECTION OF THE PROSTATE (TURP) (N/A)  Patient location during evaluation: PACU Anesthesia Type: General Level of consciousness: awake and alert Pain management: pain level controlled Vital Signs Assessment: post-procedure vital signs reviewed and stable Respiratory status: spontaneous breathing, nonlabored ventilation, respiratory function stable and patient connected to nasal cannula oxygen Cardiovascular status: blood pressure returned to baseline and stable Postop Assessment: no signs of nausea or vomiting Anesthetic complications: no    Last Vitals:  Vitals:   08/13/16 1053 08/13/16 1303  BP: (!) 143/89 134/77  Pulse: 64 68  Resp: 14 16  Temp: 36.5 C 36.5 C    Last Pain:  Vitals:   08/13/16 1303  TempSrc: Oral  PainSc:                  Molli Barrows

## 2016-08-13 NOTE — H&P (View-Only) (Signed)
08/03/2016 12:02 PM   Darin Bell [birth year] [id number]  Referring provider: Sherrin Daisy, MD [place],  [id number]  Chief Complaint  Patient presents with  . Cysto    HPI: 70 yo M who Hurt for refractory urinary symptoms who presents today for cystoscopy to evaluate his prostatic and bladder anatomy.    He reports that over tha last 7-8 years, he has difficulty starting his urinary stream.  His stream is very weak and he feels like he not able to fully empty the bladder.  Nocturia x 2.   Minimal daytime frequency or urgency.  No incontinence.    He reports that he has always had a "bashful kidneys" even as a child when trying to void in a public restroom.    His urinary stream does spray.   He has had Foley catheters in the past for post op urinary retention in mid 90s x 2.   No issues with UTIs or gross hematuria.    He has seen Dr. Jacqlyn Larsen in 2014.  He was also given alfuzosin and tamsulosin which made him dizzy.  He also took dutasteride for over 6 months without any effect.    He drinks only 1 cup of coffee.    Most recent PSA 1.09 on 08/2015.  He does have a family history of prostate cancer (fatheter).  Rectal exam last visit approximate 40 cc, no masses.  PVR has been minimal.      IPSS    Row Name 07/20/16 1000         International Prostate Symptom Score   How often have you had the sensation of not emptying your bladder? Less than half the time     How often have you had to urinate less than every two hours? Less than half the time     How often have you found you stopped and started again several times when you urinated? Less than half the time     How often have you found it difficult to postpone urination? Less than 1 in 5 times     How often have you had a weak urinary stream? Almost always     How often have you had to strain to start urination? Less than half the time     How many times did you typically get up at night to urinate? 3  Times     Total IPSS Score 17       Quality of Life due to urinary symptoms   If you were to spend the rest of your life with your urinary condition just the way it is now how would you feel about that? Unhappy        Score:  1-7 Mild 8-19 Moderate 20-35 Severe  PMH: Past Medical History:  Diagnosis Date  . Benign localized hyperplasia of prostate with urinary obstruction and other lower urinary tract symptoms (LUTS) 04/15/2013  . Enlarged prostate with lower urinary tract symptoms (LUTS) 04/15/2013  . Family history of prostate cancer 04/15/2013  . History of degenerative disc disease 07/20/2016   Overview:  Status post C-spine surgery x 2, last in 1996  . Incomplete emptying of bladder 04/15/2013  . Post-void dribbling 04/15/2013  . Pure hypercholesterolemia 09/08/2014  . Retention of urine 04/15/2013    Surgical History: Past Surgical History:  Procedure Laterality Date  . BACK SURGERY  1994  . HERNIA REPAIR  2015    Home Medications:    Medication List  as of 08/03/2016 12:02 PM   You have not been prescribed any medications.     Allergies:  Allergies  Allergen Reactions  . Oxycodone-Acetaminophen     Other reaction(s): Unknown Constipation, Retention per patient  . Alfuzosin     Other reaction(s): Dizziness  . Dutasteride     Other reaction(s): Other (See Comments) intolerant  . Tamsulosin     Other reaction(s): Dizziness    Family History: Family History  Problem Relation Age of Onset  . Prostate cancer Father   . Kidney cancer Neg Hx     Social History:  reports that he has never smoked. He has never used smokeless tobacco. He reports that he does not drink alcohol or use drugs.  Physical Exam: BP (!) 156/84   Pulse 72   Ht 5\' 10"  (1.778 m)   Wt 180 lb (81.6 kg)   BMI 25.83 kg/m   Constitutional:  Alert and oriented, No acute distress. HEENT: [place] AT, moist mucus membranes.  Trachea midline, no masses. RRR. Cardiovascular: No clubbing, cyanosis,  or edema. CTAB. Respiratory: Normal respiratory effort, no increased work of breathing. GI: Abdomen is soft, nontender, nondistended, no abdominal masses GU: No CVA tenderness. Normal circumcised phallus with orthotopic widely patent meatus. Skin: No rashes, bruises or suspicious lesions. Neurologic: Grossly intact, no focal deficits, moving all 4 extremities. Psychiatric: Normal mood and affect.  Laboratory Data: Comprehensive Metabolic Panel (CMP) (57/84/6962 7:30 AM) Comprehensive Metabolic Panel (CMP) (95/28/4132 7:30 AM)  Component Value Ref Range  Glucose 97 70 - 110 mg/dL  Sodium 140 136 - 145 mmol/L  Potassium 4.3 3.6 - 5.1 mmol/L  Chloride 107 97 - 109 mmol/L  Carbon Dioxide (CO2) 25.8 22.0 - 32.0 mmol/L  Urea Nitrogen (BUN) 20 7 - 25 mg/dL  Creatinine 0.9 0.7 - 1.3 mg/dL   PSA, Total (Screen) (09/05/2015 7:30 AM) PSA, Total (Screen) (09/05/2015 7:30 AM)  Component Value Ref Range  PSA (Prostate Specific Antigen), Total 1.08 0.10 - 4.00 ng/mL    Urinalysis Results for orders placed or performed in visit on 08/03/16  Microscopic Examination  Result Value Ref Range   WBC, UA 0-5 0 - 5 /hpf   RBC, UA None seen 0 - 2 /hpf   Epithelial Cells (non renal) None seen 0 - 10 /hpf   Bacteria, UA None seen None seen/Few  Urinalysis, Complete  Result Value Ref Range   Specific Gravity, UA 1.020 1.005 - 1.030   pH, UA 7.0 5.0 - 7.5   Color, UA Yellow Yellow   Appearance Ur Clear Clear   Leukocytes, UA Negative Negative   Protein, UA Negative Negative/Trace   Glucose, UA Negative Negative   Ketones, UA Negative Negative   RBC, UA Negative Negative   Bilirubin, UA Negative Negative   Urobilinogen, Ur 1.0 0.2 - 1.0 mg/dL   Nitrite, UA Negative Negative   Microscopic Examination See below:     Uroflow today reviewed. This reveals peak flow of 7 mL's per second, voided volume approximate 230 cc with flat drawnout curve consistent with obstruction.  Cystoscopy Procedure  Note  Patient identification was confirmed, informed consent was obtained, and patient was prepped using Betadine solution.  Lidocaine jelly was administered per urethral meatus.    Preoperative abx where received prior to procedure.     Pre-Procedure: - Inspection reveals a normal caliber ureteral meatus.  Procedure: The flexible cystoscope was introduced without difficulty - No urethral strictures/lesions are present. - Enlarged prostate with bilobar coaptation, 4 cm  prostatic length - Mildly elevated bladder neck - Bilateral ureteral orifices identified - Bladder mucosa  reveals no ulcers, tumors, or lesions - No bladder stones - Mild trabeculation of bladder when descended  Retroflexion shows concentric intravesical protrusion of prostate, mild   Post-Procedure: - Patient tolerated the procedure well  Assessment/ Plan:  Assessment & Plan:  70 year old male with BPH symptoms, primarily obstructive who has failed medical management. He is unable to tolerate off of blockers and had no benefit prolong course of 5-ARIs.    Cystoscopy today reveals a 4 cm prostate with bilobar coaptation but no obvious median lobe although there is some intravesical protrusion. He does have an obstructive voiding pattern with a low flow consistent with obstruction.  I counseled him for consideration of an outlet procedure. Based on the size and shape of the gland, I feel that he would do well with a classic TURP versus laser ablation of the prostate. Recent benefits of each were discussed in detail. All of his questions were answered. He is most interested in TURP.  Risk including bleeding, infection, damage to surrounding structures including prostate, bladder, and bladder neck, bladder neck contracture, incontinence, worsening of his urgency frequency, retrograde ejaculation, amongst others were discussed. We also discussed his need for overnight hospitalization with CBI and Foley catheterization.  He understands all this and is willing to proceed.  1. BPH with obstruction/lower urinary tract symptoms - Urinalysis, Complete - Uroflow  2. History of urinary retention   Schedule TURP  Hollice Espy, MD  [facility] [address] [place], [place] [address] [phone]

## 2016-08-13 NOTE — Discharge Instructions (Signed)
Transurethral Resection of the Prostate, Care After °Refer to this sheet in the next few weeks. These instructions provide you with information on caring for yourself after your procedure. Your caregiver also may give you specific instructions. Your treatment has been planned according to current medical practices, but complications sometimes occur. Call your caregiver if you have any problems or questions after your procedure. °HOME CARE INSTRUCTIONS  °Recovery can take 4-6 weeks. Avoid alcohol, caffeinated drinks, and spicy foods for 2 weeks after your procedure. Drink enough fluids to keep your urine clear or pale yellow. Urinate as soon as you feel the urge to do so. Do not try to hold your urine for long periods of time. °During recovery you may experience pain caused by bladder spasms, which result in a very intense urge to urinate. Take all medicines as directed by your caregiver, including medicines for pain. Try to limit the amount of pain medicines you take because it can cause constipation. If you do become constipated, do not strain to move your bowels. Straining can increase bleeding. Constipation can be minimized by increasing the amount fluids and fiber in your diet. Your caregiver also may prescribe a stool softener. °Do not lift heavy objects (more than 5 lb [2.25 kg]) or perform exercises that cause you to strain for at least 1 month after your procedure. When sitting, you may want to sit in a soft chair or use a cushion. For the first 10 days after your procedure, avoid the following activities: °· Running. °· Strenuous work. °· Long walks. °· Riding in a car for extended periods. °· Sex. °SEEK MEDICAL CARE IF: °· You have difficulty urinating. °· You have blood in your urine that does not go away after you rest or increase your fluid intake. °· You have swelling in your penis or scrotum. °SEEK IMMEDIATE MEDICAL CARE IF:  °· You are suddenly unable to urinate. °· You notice blood clots in your  urine. °· You have chills. °· You have a fever. °· You have pain in your back or lower abdomen. °· You have pain or swelling in your legs. °MAKE SURE YOU:  °· Understand these instructions. °· Will watch your condition. °· Will get help right away if you are not doing well or get worse. °  °This information is not intended to replace advice given to you by your health care provider. Make sure you discuss any questions you have with your health care provider. °  °Document Released: 10/01/2005 Document Revised: 10/22/2014 Document Reviewed: 11/09/2011 °Elsevier Interactive Patient Education ©2016 [employer] ° °

## 2016-08-13 NOTE — Progress Notes (Signed)
[place] making rounds visited Pt. Pt appeared to be tired and so [place] spoke briefly with him and promised to visit him another time. Note: [place] plans to do a follow-up visit with this Pt.     08/13/16 1600  Clinical Encounter Type  Visited With Patient  Visit Type Initial;Spiritual support  Spiritual Encounters  Spiritual Needs Prayer

## 2016-08-13 NOTE — Anesthesia Procedure Notes (Signed)
Procedure Name: LMA Insertion Date/Time: 08/13/2016 7:44 AM Performed by: Darlyne Russian Pre-anesthesia Checklist: Patient identified, Emergency Drugs available, Suction available and Patient being monitored Patient Re-evaluated:Patient Re-evaluated prior to inductionOxygen Delivery Method: Circle system utilized Preoxygenation: Pre-oxygenation with 100% oxygen Intubation Type: IV induction LMA: LMA inserted LMA Size: 5.0 Number of attempts: 2 (LMA 4 placed initially and did not seat well, changed to LMA 5 with good seal.) Dental Injury: Teeth and Oropharynx as per pre-operative assessment

## 2016-08-13 NOTE — Op Note (Signed)
Date of procedure: 08/13/16  Preoperative diagnosis:  1. BPH with lower urinary tract symptoms 2. History of urinary retention   Postoperative diagnosis:  1. Same as above   Procedure: 1. Transurethral resection of the prostate  Surgeon: Hollice Espy, MD  Anesthesia: General  Complications: None  Intraoperative findings: 4 cm prostate with minimal intravesical component of the prostate, no significant median lobe  EBL: 100 cc  Specimens: Prostate chips  Drains: 20 French three-way Foley catheter  Indication: Darin Bell is a 70 y.o. patient with history of BPH with lower urinary tract symptoms and history of retention was failed medical management.  After reviewing the management options for treatment, he elected to proceed with the above surgical procedure(s). We have discussed the potential benefits and risks of the procedure, side effects of the proposed treatment, the likelihood of the patient achieving the goals of the procedure, and any potential problems that might occur during the procedure or recuperation. Informed consent has been obtained.  Description of procedure:  The patient was taken to the operating room and general anesthesia was induced.  The patient was placed in the dorsal lithotomy position, prepped and draped in the usual sterile fashion, and preoperative antibiotics were administered. A preoperative time-out was performed.   A 26 French resectoscope was advanced per urethra into the bladder using coud tip obturator. The 30 lens was then brought in along with the resectoscope loop and the bladder was carefully inspected. This was unremarkable with minimal trabeculation. The trigone was a good distance from the bladder neck. The bladder neck was not particularly elevated and there was minimal component of intravesical prostate. There was bilobar coaptation approximate 4 cm in length. This point time, patient lateral lobes were taken down using the bipolar  loop and saline as medium. The resection was taken down to capsule. Care was taken to avoid any resection beyond the vera. At the end of the case, a large open prostate channel was created with a nice TURP defect. The bladder neck was widely patent. Hemostasis is carefully achieved. A Toomey syringe was used to evacuate the prostate chips from the bladder. These were passed off the field as prostate chips. The prostatic fossa was then carefully evaluated and there is no evidence of significant residual adenoma and the bleeding was minimal. The scope was then removed. A 20 French three-way Foley catheter was then placed in the balloon was filled with 30 cc of sterile water. Gentle CBI was initiated. He was cleaned dried, repositioned supine position, reversed from anesthesia, and taken to the PACU in stable condition.  Plan: Patient will be admitted overnight for CBI. We will stop the drip early tomorrow morning and attempt a voiding trial prior to discharge.  Hollice Espy, M.D.

## 2016-08-14 DIAGNOSIS — N401 Enlarged prostate with lower urinary tract symptoms: Secondary | ICD-10-CM | POA: Diagnosis not present

## 2016-08-14 LAB — BASIC METABOLIC PANEL
Anion gap: 4 — ABNORMAL LOW (ref 5–15)
BUN: 19 mg/dL (ref 6–20)
CALCIUM: 8.6 mg/dL — AB (ref 8.9–10.3)
CHLORIDE: 107 mmol/L (ref 101–111)
CO2: 26 mmol/L (ref 22–32)
CREATININE: 0.91 mg/dL (ref 0.61–1.24)
GFR calc Af Amer: >60 mL/min (ref >60)
GFR calc non Af Amer: >60 mL/min (ref >60)
Glucose, Bld: 120 mg/dL — ABNORMAL HIGH (ref 65–99)
Potassium: 4.4 mmol/L (ref 3.5–5.1)
Sodium: 137 mmol/L (ref 135–145)

## 2016-08-14 LAB — CBC
HEMATOCRIT: 39 % — AB (ref 40.0–52.0)
HEMOGLOBIN: 13.4 g/dL (ref 13.0–18.0)
MCH: 32.8 pg (ref 26.0–34.0)
MCHC: 34.3 g/dL (ref 32.0–36.0)
MCV: 95.6 fL (ref 80.0–100.0)
Platelets: 134 10*3/uL — ABNORMAL LOW (ref 150–440)
RBC: 4.08 MIL/uL — ABNORMAL LOW (ref 4.40–5.90)
RDW: 13.2 % (ref 11.5–14.5)
WBC: 14.4 10*3/uL — ABNORMAL HIGH (ref 3.8–10.6)

## 2016-08-14 LAB — SURGICAL PATHOLOGY

## 2016-08-14 NOTE — Progress Notes (Signed)
Pt was standing up consuming lunch. Pt expects to be discharged soon. Pt hopes that issues of many years have been resolved.    08/14/16 1140  Clinical Encounter Type  Visited With Patient  Visit Type Initial  Referral From Nurse  Spiritual Encounters  Spiritual Needs Emotional  Stress Factors  Patient Stress Factors None identified

## 2016-08-14 NOTE — Progress Notes (Signed)
[place] made follow-up visit with Pt he had seen earlier and found out that another [facility] has already visited with the Pt, who was about to be discharged. [place] spoke with Pt and his wife and offered prayers for safety as they traveled home. Pt and his wife were very appreciated for the chaplain's services.   08/14/16 1500  Clinical Encounter Type  Visited With Patient and family together  Visit Type Follow-up;Spiritual support  Spiritual Encounters  Spiritual Needs Prayer

## 2016-08-14 NOTE — Care Management Obs Status (Signed)
[place] NOTIFICATION   Patient Details  Name: Darin Bell MRN: [id number] Date of Birth: 18-Apr-1946   Medicare Observation Status Notification Given:  No (admitted obs less than 24 hours)    Beverly Sessions, RN 08/14/2016, 10:24 AM

## 2016-08-14 NOTE — Progress Notes (Signed)
Patient discharged to home.  Patient voided 110 Post void residual Bladder scan resulted in 71ml  Prescription given. Education hand out and discharge summary given concern addressed.

## 2016-08-14 NOTE — Discharge Summary (Signed)
Date of admission: 08/13/2016  Date of discharge: 08/14/2016  Admission diagnosis: BPH with lower urinary tract symptoms, history of urinary retention  Discharge diagnosis: Same as above  Secondary diagnoses:  Patient Active Problem List   Diagnosis Date Noted  . BPH with obstruction/lower urinary tract symptoms 08/13/2016  . History of degenerative disc disease 07/20/2016  . Sun-damaged skin 07/20/2016  . Pure hypercholesterolemia 09/08/2014  . Benign localized hyperplasia of prostate with urinary obstruction and other lower urinary tract symptoms (LUTS) 04/15/2013  . Enlarged prostate with lower urinary tract symptoms (LUTS) 04/15/2013  . Family history of prostate cancer 04/15/2013  . Incomplete emptying of bladder 04/15/2013  . Post-void dribbling 04/15/2013  . Retention of urine 04/15/2013    History and Physical: For full details, please see admission history and physical. Briefly, Darin Bell is a 70 y.o. year old patient with History of BPH with bladder outlet obstruction who was admitted for CBI following transurethral resection of the prostate.Marland Kitchen   Hospital Course: Patient tolerated the procedure well.  He was then transferred to the floor after an uneventful PACU stay.  His hospital course was uncomplicated.  He remained on CBI overnight and this was able to be titrated off by morning.  On POD#1 he had met discharge criteria: was eating a regular diet, was up and ambulating independently,  pain was well controlled, was voiding without a catheter, and was ready to for discharge.  Physical Exam  Constitutional: He is oriented to person, place, and time. He appears well-developed and well-nourished.  HENT:  Head: Normocephalic and atraumatic.  Eyes: EOM are normal. Pupils are equal, round, and reactive to light.  Cardiovascular: Normal rate.   Pulmonary/Chest: Effort normal.  Abdominal: Soft.  Genitourinary: Penis normal.  Genitourinary Comments: Foley draining clear yellow  urine with CBI off  Neurological: He is alert and oriented to person, place, and time.  Skin: Skin is warm and dry.  Vitals reviewed.    Laboratory values:   Recent Labs  08/14/16 0552  WBC 14.4*  HGB 13.4  HCT 39.0*    Recent Labs  08/14/16 0552  NA 137  K 4.4  CL 107  CO2 26  GLUCOSE 120*  BUN 19  CREATININE 0.91  CALCIUM 8.6*   No results for input(s): LABPT, INR in the last 72 hours. No results for input(s): LABURIN in the last 72 hours. Results for orders placed or performed during the hospital encounter of 08/08/16  Urine culture     Status: None   Collection Time: 08/08/16  9:18 AM  Result Value Ref Range Status   Specimen Description URINE, CLEAN CATCH  Final   Special Requests NONE  Final   Culture NO GROWTH Performed at [facility]   Final   Report Status 08/09/2016 FINAL  Final    Disposition: Home  Discharge instruction: See attached discharge instructions  Discharge medications:    Medication List    TAKE these medications   docusate sodium 100 MG capsule Commonly known as:  COLACE Take 1 capsule (100 mg total) by mouth 2 (two) times daily.   HYDROcodone-acetaminophen 5-325 MG tablet Commonly known as:  NORCO/VICODIN Take 1-2 tablets by mouth every 6 (six) hours as needed for moderate pain.   naproxen sodium 220 MG tablet Commonly known as:  ANAPROX Take 220-440 mg by mouth 2 (two) times daily as needed (for headache/muscle tiredness.).       Followup:  Follow-up Information    Vanna Scotland, MD In  6 weeks.   Specialty:  Urology Contact information: [address] [place] [address] [phone]

## 2016-08-15 ENCOUNTER — Ambulatory Visit (INDEPENDENT_AMBULATORY_CARE_PROVIDER_SITE_OTHER): Payer: PPO

## 2016-08-15 VITALS — BP 172/97 | HR 73 | Ht 71.0 in | Wt 186.2 lb

## 2016-08-15 DIAGNOSIS — N401 Enlarged prostate with lower urinary tract symptoms: Secondary | ICD-10-CM | POA: Diagnosis not present

## 2016-08-15 DIAGNOSIS — N138 Other obstructive and reflux uropathy: Secondary | ICD-10-CM

## 2016-08-15 LAB — BLADDER SCAN AMB NON-IMAGING: Scan Result: 406

## 2016-08-15 NOTE — Progress Notes (Signed)
Simple Catheter Placement  Due to urinary retention patient is present today for a foley cath placement.  Patient was cleaned and prepped in a sterile fashion with betadine and lidocaine jelly 2% was instilled into the urethra.  A 16 FR coude foley catheter was inserted, urine return was noted  437ml, urine was dark yellow in color.  The balloon was filled with 10cc of sterile water.  A leg bag was attached for drainage. Patient was also given a night bag to take home and was given instruction on how to change from one bag to another.  Patient was given instruction on proper catheter care.  Patient tolerated well, no complications were noted   Preformed by: Toniann Fail, LPN   Additional notes/ Follow up: Per Dr. Erlene Quan pt will RTC in 1 week for a voiding trial.   Blood pressure (!) 172/97, pulse 73, height 5\' 11"  (1.803 m), weight 186 lb 3.2 oz (84.5 kg).

## 2016-08-20 ENCOUNTER — Ambulatory Visit: Payer: PPO

## 2016-08-20 DIAGNOSIS — Z87898 Personal history of other specified conditions: Secondary | ICD-10-CM

## 2016-08-20 NOTE — Progress Notes (Signed)
Pt presented today c/o severe pain with cath and not being able to get comfortable. Pt stated that at times urine comes out around the catheter. Per Larene [place] catheter was removed and pt was sent home. Reinforced with pt to drink plenty of fluids today and if not able to urinate to come back to clinic. Pt voiced understanding.

## 2016-08-22 ENCOUNTER — Ambulatory Visit: Payer: Self-pay

## 2016-08-22 ENCOUNTER — Ambulatory Visit (INDEPENDENT_AMBULATORY_CARE_PROVIDER_SITE_OTHER): Payer: PPO | Admitting: Family Medicine

## 2016-08-22 VITALS — BP 132/79 | HR 81 | Temp 98.7°F | Ht 71.0 in | Wt 186.0 lb

## 2016-08-22 DIAGNOSIS — Z87898 Personal history of other specified conditions: Secondary | ICD-10-CM | POA: Diagnosis not present

## 2016-08-22 DIAGNOSIS — R350 Frequency of micturition: Secondary | ICD-10-CM | POA: Diagnosis not present

## 2016-08-22 LAB — MICROSCOPIC EXAMINATION: RBC, UA: >30 /hpf — AB (ref 0–?)

## 2016-08-22 LAB — URINALYSIS, COMPLETE
Bilirubin, UA: NEGATIVE
Glucose, UA: NEGATIVE
Ketones, UA: NEGATIVE
Nitrite, UA: POSITIVE — AB
Specific Gravity, UA: >1.03 — ABNORMAL HIGH (ref 1.005–1.030)
Urobilinogen, Ur: 1 mg/dL (ref 0.2–1.0)
pH, UA: 5.5 (ref 5.0–7.5)

## 2016-08-22 LAB — BLADDER SCAN AMB NON-IMAGING: Scan Result: 0

## 2016-08-22 MED ORDER — SULFAMETHOXAZOLE-TRIMETHOPRIM 800-160 MG PO TABS: 1.0000 | ORAL_TABLET | Freq: Two times a day (BID) | ORAL | 0 refills | Status: DC

## 2016-08-22 NOTE — Progress Notes (Signed)
Patient presents today with urinary frequency with mild burning during urination. He has noticed some blood in the urine. He complains of fever and chills. Today at visit his temp was 98.7. A bladder scan was performed with 0 in the bladder.  He did have Urological surgery on 08/13/16. A UA and Culture is ordered.

## 2016-08-22 NOTE — Addendum Note (Signed)
Addended by: Hollice Espy on: 08/22/2016 09:38 AM   Modules accepted: Orders

## 2016-08-22 NOTE — Progress Notes (Signed)
Please go ahead at treat with Bactrim DS bid, will adjust as needed.  Patient aware.   Hollice Espy, MD

## 2016-08-24 ENCOUNTER — Telehealth: Payer: Self-pay | Admitting: Urology

## 2016-08-24 LAB — CULTURE, URINE COMPREHENSIVE

## 2016-08-24 MED ORDER — CIPROFLOXACIN HCL 500 MG PO TABS: 500.0000 mg | ORAL_TABLET | Freq: Two times a day (BID) | ORAL | 0 refills | Status: DC

## 2016-08-24 NOTE — Telephone Encounter (Signed)
Called patient, unable to reach.  Tried both number listed.      Don't have UCx back yet, still pending but does appear to be growing bacteria.    Lets switch meds to cipro but have have to switch again.   Sent to him pharmacy.    Hollice Espy, MD

## 2016-08-24 NOTE — Telephone Encounter (Signed)
Spoke to the patient and he understands  michelle

## 2016-08-24 NOTE — Telephone Encounter (Signed)
Darin Bell called saying he had lab work performed here on 11.8.17 and would like a phone call with his results. He also states he's having side effects from having taken the Bactrim that was prescribed to him and he's very concerned. The side effects are: the feeling of his stomach being in knots, having chills, and body aches as though he has the flu. He read online that these symptoms are common but wants a phone call regarding this. He took two Bactrim pills on Wednesday and Thursday but hasn't taken any today. He's also not had any bowel movements since Tuesday.  Please give him a phone call regarding this.  Pt's ph# [phone] Thank you.

## 2016-08-24 NOTE — Telephone Encounter (Signed)
Can you let me know what to do about this pt he keeps calling the office and said he needs an answer today?   michelle

## 2016-08-27 ENCOUNTER — Telehealth: Payer: Self-pay

## 2016-08-27 NOTE — Telephone Encounter (Signed)
-----   Message from Hollice Espy, MD sent at 08/27/2016  9:10 AM EST ----- Please let him know both bactrim and cipro should be helping.  UCx results reviewed.    Hollice Espy, MD

## 2016-08-27 NOTE — Telephone Encounter (Signed)
Spoke with pt in reference to +ucx. Made aware on appropriate abx. Pt stated that cipro and bactrim both upset his stomach so bad that he now has quit taking both abx. Reinforced with pt the need of abx. Pt stated that he was not going to take anymore of them. Please advise.

## 2016-08-28 NOTE — Telephone Encounter (Signed)
Patient called the office again this morning.  He is still wondering what he should do.  When he takes the antibiotics, he has upset stomach, headaches, body aches and has difficulty urinating.  He has not taken any antibiotics since Thursday and claims that he is feeling better, just frequent urination.  I advised him that he has a positive urine culture and he needs to be treated for the infection.  Do you have any other advice for him?

## 2016-08-28 NOTE — Telephone Encounter (Signed)
Spoke with pt in reference to needing to take abx. Made pt aware can take probiotic with abx to help with side effects. Pt was not happy but voiced understanding.

## 2016-08-28 NOTE — Telephone Encounter (Signed)
No.  A lot of his irritation is secondary to surgery itself and there is a healing process, he needs to be patient.  He really does need to complete the abx.  Add a probiotic to see if he can tolerate it better.    Hollice Espy, MD

## 2016-09-27 ENCOUNTER — Ambulatory Visit (INDEPENDENT_AMBULATORY_CARE_PROVIDER_SITE_OTHER): Payer: PPO | Admitting: Urology

## 2016-09-27 ENCOUNTER — Encounter: Payer: Self-pay | Admitting: Urology

## 2016-09-27 VITALS — BP 131/77 | HR 75 | Ht 71.0 in | Wt 187.0 lb

## 2016-09-27 DIAGNOSIS — N401 Enlarged prostate with lower urinary tract symptoms: Secondary | ICD-10-CM | POA: Diagnosis not present

## 2016-09-27 DIAGNOSIS — N138 Other obstructive and reflux uropathy: Secondary | ICD-10-CM

## 2016-09-27 LAB — URINALYSIS, COMPLETE
BILIRUBIN UA: NEGATIVE
GLUCOSE, UA: NEGATIVE
Ketones, UA: NEGATIVE
NITRITE UA: NEGATIVE
PH UA: 5 (ref 5.0–7.5)
Specific Gravity, UA: 1.025 (ref 1.005–1.030)
UUROB: 0.2 mg/dL (ref 0.2–1.0)

## 2016-09-27 LAB — MICROSCOPIC EXAMINATION
Bacteria, UA: NONE SEEN
Epithelial Cells (non renal): NONE SEEN /hpf (ref 0–10)

## 2016-09-27 LAB — BLADDER SCAN AMB NON-IMAGING

## 2016-09-27 NOTE — Progress Notes (Signed)
09/27/2016 8:32 AM   Darin Bell 22-Feb-1946 [id number]  Referring provider: Sherrin Daisy, MD [place],  [address]  Chief Complaint  Patient presents with  . Benign Prostatic Hypertrophy    HPI: 70 year old male with history BPH symptoms, primarily obstructive who has failed medical management. He is unable to tolerate off of blockers and had no benefit prolong course of 5-ARIs.   He does have a history of postoperative urinary retention times multiple.  Cystoscopy revealed a 4 cm prostate with bilobar coaptation but no obvious median lobe although there is some intravesical protrusion. He does have an obstructive voiding pattern with a low flow consistent with obstruction.  He underwent transurethral resection of the prostate on 09/13/2016. His postoperative course was complicated by urinary retention and urinary tract infection for which she was treated.  Surgical pathology was benign with evidence of chronic inflammation.  Most recent PSA 1.09 on 08/2015.  He does have a family history of prostate cancer (father).  Rectal exam 07/2016 approximate 40 cc, no masses.  Today, he reports improved urinary stream and no longer stops and starts his stream.  He gets up only once at night to void. He is quite pleased with the results. He denies any UTI symptoms including urinary frequency, urgency, dysuria, or gross hematuria.   PMH: Past Medical History:  Diagnosis Date  . Benign localized hyperplasia of prostate with urinary obstruction and other lower urinary tract symptoms (LUTS) 04/15/2013  . Cancer ([place])    skin cancer  . Enlarged prostate with lower urinary tract symptoms (LUTS) 04/15/2013  . Family history of prostate cancer 04/15/2013  . History of degenerative disc disease 07/20/2016   Overview:  Status post C-spine surgery x 2, last in 1996  . Incomplete emptying of bladder 04/15/2013  . Post-void dribbling 04/15/2013  . Pure hypercholesterolemia 09/08/2014    . Retention of urine 04/15/2013    Surgical History: Past Surgical History:  Procedure Laterality Date  . BACK SURGERY  1994  . EYE SURGERY Bilateral    Cataract Extraction with IOL  . HERNIA REPAIR Right 2015   Inguinal Hernia Repair  . TRANSURETHRAL RESECTION OF PROSTATE N/A 08/13/2016   Procedure: TRANSURETHRAL RESECTION OF THE PROSTATE (TURP);  Surgeon: Hollice Espy, MD;  Location: [facility] ORS;  Service: Urology;  Laterality: N/A;    Home Medications:  Allergies as of 09/27/2016      Reactions   Oxycodone-acetaminophen    Other reaction(s): Unknown Constipation, Retention per patient   Ciprofloxacin Diarrhea   Alfuzosin    Other reaction(s): Dizziness   Bactrim [sulfamethoxazole-trimethoprim] Other (See Comments)   GI distress   Dutasteride    Other reaction(s): Other (See Comments) intolerant   Tamsulosin    Other reaction(s): Dizziness      Medication List       Accurate as of 09/27/16 11:59 PM. Always use your most recent med list.          naproxen sodium 220 MG tablet Commonly known as:  ANAPROX Take 220-440 mg by mouth 2 (two) times daily as needed (for headache/muscle tiredness.).       Allergies:  Allergies  Allergen Reactions  . Oxycodone-Acetaminophen     Other reaction(s): Unknown Constipation, Retention per patient  . Ciprofloxacin Diarrhea  . Alfuzosin     Other reaction(s): Dizziness  . Bactrim [Sulfamethoxazole-Trimethoprim] Other (See Comments)    GI distress  . Dutasteride     Other reaction(s): Other (See Comments) intolerant  .  Tamsulosin     Other reaction(s): Dizziness    Family History: Family History  Problem Relation Age of Onset  . Prostate cancer Father   . Kidney cancer Neg Hx     Social History:  reports that he has never smoked. He has never used smokeless tobacco. He reports that he does not drink alcohol or use drugs.  ROS: UROLOGY Frequent Urination?: No Hard to postpone urination?: No Burning/pain with  urination?: No Get up at night to urinate?: No Leakage of urine?: No Urine stream starts and stops?: No Trouble starting stream?: No Do you have to strain to urinate?: No Blood in urine?: No Urinary tract infection?: No Sexually transmitted disease?: No Injury to kidneys or bladder?: No Painful intercourse?: No Weak stream?: No Erection problems?: No Penile pain?: No  Gastrointestinal Nausea?: No Vomiting?: No Indigestion/heartburn?: No Diarrhea?: No Constipation?: No  Constitutional Fever: No Night sweats?: No Weight loss?: No Fatigue?: No  Skin Skin rash/lesions?: No Itching?: No  Eyes Blurred vision?: No Double vision?: No  Ears/Nose/Throat Sore throat?: No Sinus problems?: No  Hematologic/Lymphatic Swollen glands?: No Easy bruising?: No  Cardiovascular Leg swelling?: No Chest pain?: No  Respiratory Cough?: No Shortness of breath?: No  Endocrine Excessive thirst?: No  Musculoskeletal Back pain?: No Joint pain?: No  Neurological Headaches?: No Dizziness?: No  Psychologic Depression?: No Anxiety?: No  Physical Exam: BP 131/77   Pulse 75   Ht 5\' 11"  (1.803 m)   Wt 187 lb (84.8 kg)   BMI 26.08 kg/m   Constitutional:  Alert and oriented, No acute distress. HEENT: [place] AT, moist mucus membranes.  Trachea midline, no masses. Cardiovascular: No clubbing, cyanosis, or edema. Respiratory: Normal respiratory effort, no increased work of breathing. GI: Abdomen is soft, nontender, nondistended, no abdominal masses GU: No CVA tenderness.  Skin: No rashes, bruises or suspicious lesions. Neurologic: Grossly intact, no focal deficits, moving all 4 extremities. Psychiatric: Normal mood and affect.  Laboratory Data: Lab Results  Component Value Date   WBC 14.4 (H) 08/14/2016   HGB 13.4 08/14/2016   HCT 39.0 (L) 08/14/2016   MCV 95.6 08/14/2016   PLT 134 (L) 08/14/2016    Lab Results  Component Value Date   CREATININE 0.91 08/14/2016     Urinalysis Results for orders placed or performed in visit on 09/27/16  Microscopic Examination  Result Value Ref Range   WBC, UA >30 (A) 0 - 5 /hpf   RBC, UA >30 (A) 0 - 2 /hpf   Epithelial Cells (non renal) None seen 0 - 10 /hpf   Mucus, UA Present (A) Not Estab.   Bacteria, UA None seen None seen/Few  Urinalysis, Complete  Result Value Ref Range   Specific Gravity, UA 1.025 1.005 - 1.030   pH, UA 5.0 5.0 - 7.5   Color, UA Yellow Yellow   Appearance Ur Cloudy (A) Clear   Leukocytes, UA 2+ (A) Negative   Protein, UA 1+ (A) Negative/Trace   Glucose, UA Negative Negative   Ketones, UA Negative Negative   RBC, UA 3+ (A) Negative   Bilirubin, UA Negative Negative   Urobilinogen, Ur 0.2 0.2 - 1.0 mg/dL   Nitrite, UA Negative Negative   Microscopic Examination See below:   BLADDER SCAN AMB NON-IMAGING  Result Value Ref Range   Scan Result 103ml      Pertinent Imaging: Results for orders placed or performed in visit on 09/27/16  Microscopic Examination  Result Value Ref Range   WBC, UA >30 (  A) 0 - 5 /hpf   RBC, UA >30 (A) 0 - 2 /hpf   Epithelial Cells (non renal) None seen 0 - 10 /hpf   Mucus, UA Present (A) Not Estab.   Bacteria, UA None seen None seen/Few  Urinalysis, Complete  Result Value Ref Range   Specific Gravity, UA 1.025 1.005 - 1.030   pH, UA 5.0 5.0 - 7.5   Color, UA Yellow Yellow   Appearance Ur Cloudy (A) Clear   Leukocytes, UA 2+ (A) Negative   Protein, UA 1+ (A) Negative/Trace   Glucose, UA Negative Negative   Ketones, UA Negative Negative   RBC, UA 3+ (A) Negative   Bilirubin, UA Negative Negative   Urobilinogen, Ur 0.2 0.2 - 1.0 mg/dL   Nitrite, UA Negative Negative   Microscopic Examination See below:   BLADDER SCAN AMB NON-IMAGING  Result Value Ref Range   Scan Result 37ml     Assessment & Plan:    1. BPH with obstruction/lower urinary tract symptoms Doing well s/p TURP UA today c/w post TURP, however, will recheck to ensure no ongoing  infection PVR minimal - BLADDER SCAN AMB NON-IMAGING   Return in about 6 months (around 03/28/2017) for PSA/ DRE, IPSS, PVR.  Hollice Espy, MD  [facility] [address] [place], [place] [address] [phone]

## 2016-10-03 LAB — CULTURE, URINE COMPREHENSIVE

## 2016-10-04 ENCOUNTER — Telehealth: Payer: Self-pay

## 2016-10-04 NOTE — Telephone Encounter (Signed)
Spoke with pt in reference to ucx results. Pt denied any n/v, f/c, dysuria, lower abx pain, or back pain. Made pt aware if s/s develop to give [place] a call and if over the holidays go to the ER. Pt voiced understanding.

## 2016-10-04 NOTE — Telephone Encounter (Signed)
-----   Message from Hollice Espy, MD sent at 10/04/2016  2:59 PM EST ----- Please let Mr. Kolk know that we did send a urine culture which grew bacteria but if he is not having symptoms, will defer treatment.  Please let us know if he develops pain with urination, fevers, etc... Hollice Espy, MD

## 2016-12-27 DIAGNOSIS — L821 Other seborrheic keratosis: Secondary | ICD-10-CM | POA: Diagnosis not present

## 2016-12-27 DIAGNOSIS — Z85828 Personal history of other malignant neoplasm of skin: Secondary | ICD-10-CM | POA: Diagnosis not present

## 2016-12-27 DIAGNOSIS — Z1283 Encounter for screening for malignant neoplasm of skin: Secondary | ICD-10-CM | POA: Diagnosis not present

## 2016-12-27 DIAGNOSIS — L814 Other melanin hyperpigmentation: Secondary | ICD-10-CM | POA: Diagnosis not present

## 2016-12-27 DIAGNOSIS — D229 Melanocytic nevi, unspecified: Secondary | ICD-10-CM | POA: Diagnosis not present

## 2016-12-27 DIAGNOSIS — L570 Actinic keratosis: Secondary | ICD-10-CM | POA: Diagnosis not present

## 2016-12-27 DIAGNOSIS — L578 Other skin changes due to chronic exposure to nonionizing radiation: Secondary | ICD-10-CM | POA: Diagnosis not present

## 2017-01-25 DIAGNOSIS — Z8739 Personal history of other diseases of the musculoskeletal system and connective tissue: Secondary | ICD-10-CM | POA: Diagnosis not present

## 2017-01-25 DIAGNOSIS — E7800 Pure hypercholesterolemia, unspecified: Secondary | ICD-10-CM | POA: Diagnosis not present

## 2017-01-25 DIAGNOSIS — Z0000 Encounter for general adult medical examination without abnormal findings: Secondary | ICD-10-CM | POA: Diagnosis not present

## 2017-01-25 DIAGNOSIS — Z125 Encounter for screening for malignant neoplasm of prostate: Secondary | ICD-10-CM | POA: Diagnosis not present

## 2017-01-30 DIAGNOSIS — E7800 Pure hypercholesterolemia, unspecified: Secondary | ICD-10-CM | POA: Diagnosis not present

## 2017-01-30 DIAGNOSIS — Z0000 Encounter for general adult medical examination without abnormal findings: Secondary | ICD-10-CM | POA: Diagnosis not present

## 2017-01-30 DIAGNOSIS — R3911 Hesitancy of micturition: Secondary | ICD-10-CM | POA: Diagnosis not present

## 2017-01-30 DIAGNOSIS — N401 Enlarged prostate with lower urinary tract symptoms: Secondary | ICD-10-CM | POA: Diagnosis not present

## 2017-01-30 DIAGNOSIS — D696 Thrombocytopenia, unspecified: Secondary | ICD-10-CM | POA: Diagnosis not present

## 2017-01-30 DIAGNOSIS — Z1211 Encounter for screening for malignant neoplasm of colon: Secondary | ICD-10-CM | POA: Diagnosis not present

## 2017-01-30 DIAGNOSIS — Z8739 Personal history of other diseases of the musculoskeletal system and connective tissue: Secondary | ICD-10-CM | POA: Diagnosis not present

## 2017-02-08 DIAGNOSIS — Z1211 Encounter for screening for malignant neoplasm of colon: Secondary | ICD-10-CM | POA: Diagnosis not present

## 2017-03-26 ENCOUNTER — Telehealth: Payer: Self-pay | Admitting: Urology

## 2017-03-26 NOTE — Telephone Encounter (Signed)
Patient's brother called and said he has been moved to hospice care and not expected to live much longer and he wanted you to be aware.  Thanks, Sharyn Lull

## 2017-03-29 ENCOUNTER — Ambulatory Visit: Payer: PPO | Admitting: Urology

## 2017-04-09 ENCOUNTER — Ambulatory Visit (INDEPENDENT_AMBULATORY_CARE_PROVIDER_SITE_OTHER): Payer: PPO | Admitting: Urology

## 2017-04-09 ENCOUNTER — Encounter: Payer: Self-pay | Admitting: Urology

## 2017-04-09 VITALS — BP 128/67 | HR 85 | Ht 71.0 in | Wt 181.0 lb

## 2017-04-09 DIAGNOSIS — N138 Other obstructive and reflux uropathy: Secondary | ICD-10-CM | POA: Diagnosis not present

## 2017-04-09 DIAGNOSIS — N401 Enlarged prostate with lower urinary tract symptoms: Secondary | ICD-10-CM

## 2017-04-09 LAB — BLADDER SCAN AMB NON-IMAGING

## 2017-04-09 NOTE — Telephone Encounter (Signed)
Please disregard message below created in error on wrong patient.   Sharyn Lull

## 2017-04-09 NOTE — Progress Notes (Signed)
04/09/2017 5:22 PM   Darin Bell 1946-07-29 [id number]  Referring provider: Sherrin Daisy, MD No address on file  Chief Complaint  Patient presents with  . Benign Prostatic Hypertrophy    35month    HPI: 71 year old male with history BPH symptoms, primarily obstructive s/p TURP on 09/13/2016.   Overall, he is pleased with his voiding symptoms. He no longer stops and starts and overall he is season flow. When his bladder is less full, his flow is not a significant. He does get up at night to void about 2 times but this is more related to dry mouth and insomnia and not due to the urge to urinate. Overall, he continues to be pleased with his symptoms.  He takes no medications for his prostate at this time.  Most recent PSA 0.37 on 01/25/17.  This is down from 1.08 on 08/2015.  Postvoid residual today was minimal.  He denies any UTI symptoms including urinary frequency, urgency, dysuria, or gross hematuria.      IPSS    Row Name 04/09/17 1500         International Prostate Symptom Score   How often have you had the sensation of not emptying your bladder? Not at All     How often have you had to urinate less than every two hours? Not at All     How often have you found you stopped and started again several times when you urinated? Not at All     How often have you found it difficult to postpone urination? Not at All     How often have you had a weak urinary stream? Less than half the time     How often have you had to strain to start urination? Not at All     How many times did you typically get up at night to urinate? 2 Times     Total IPSS Score 4       Quality of Life due to urinary symptoms   If you were to spend the rest of your life with your urinary condition just the way it is now how would you feel about that? Mostly Satisfied        Score:  1-7 Mild 8-19 Moderate 20-35 Severe    PMH: Past Medical History:  Diagnosis Date  . Benign localized  hyperplasia of prostate with urinary obstruction and other lower urinary tract symptoms (LUTS) 04/15/2013  . Cancer ([place])    skin cancer  . Enlarged prostate with lower urinary tract symptoms (LUTS) 04/15/2013  . Family history of prostate cancer 04/15/2013  . History of degenerative disc disease 07/20/2016   Overview:  Status post C-spine surgery x 2, last in 1996  . Incomplete emptying of bladder 04/15/2013  . Post-void dribbling 04/15/2013  . Pure hypercholesterolemia 09/08/2014  . Retention of urine 04/15/2013    Surgical History: Past Surgical History:  Procedure Laterality Date  . BACK SURGERY  1994  . EYE SURGERY Bilateral    Cataract Extraction with IOL  . HERNIA REPAIR Right 2015   Inguinal Hernia Repair  . TRANSURETHRAL RESECTION OF PROSTATE N/A 08/13/2016   Procedure: TRANSURETHRAL RESECTION OF THE PROSTATE (TURP);  Surgeon: Hollice Espy, MD;  Location: [facility] ORS;  Service: Urology;  Laterality: N/A;    Home Medications:  Allergies as of 04/09/2017      Reactions   Oxycodone-acetaminophen    Other reaction(s): Unknown Constipation, Retention per patient   Ciprofloxacin Diarrhea   Alfuzosin  Other reaction(s): Dizziness   Bactrim [sulfamethoxazole-trimethoprim] Other (See Comments)   GI distress   Dutasteride    Other reaction(s): Other (See Comments) intolerant   Tamsulosin    Other reaction(s): Dizziness      Medication List    as of 04/09/2017  5:22 PM   You have not been prescribed any medications.     Allergies:  Allergies  Allergen Reactions  . Oxycodone-Acetaminophen     Other reaction(s): Unknown Constipation, Retention per patient  . Ciprofloxacin Diarrhea  . Alfuzosin     Other reaction(s): Dizziness  . Bactrim [Sulfamethoxazole-Trimethoprim] Other (See Comments)    GI distress  . Dutasteride     Other reaction(s): Other (See Comments) intolerant  . Tamsulosin     Other reaction(s): Dizziness    Family History: Family History  Problem  Relation Age of Onset  . Prostate cancer Father   . Kidney cancer Neg Hx     Social History:  reports that he has never smoked. He has never used smokeless tobacco. He reports that he does not drink alcohol or use drugs.  ROS: UROLOGY Frequent Urination?: No Hard to postpone urination?: No Burning/pain with urination?: No Get up at night to urinate?: No Leakage of urine?: No Urine stream starts and stops?: No Trouble starting stream?: No Do you have to strain to urinate?: No Blood in urine?: No Urinary tract infection?: No Sexually transmitted disease?: No Injury to kidneys or bladder?: No Painful intercourse?: No Weak stream?: No Erection problems?: No Penile pain?: No  Gastrointestinal Nausea?: No Vomiting?: No Indigestion/heartburn?: No Diarrhea?: No Constipation?: No  Constitutional Fever: No Night sweats?: No Weight loss?: No Fatigue?: No  Skin Skin rash/lesions?: No Itching?: No  Eyes Blurred vision?: No Double vision?: No  Ears/Nose/Throat Sore throat?: No Sinus problems?: No  Hematologic/Lymphatic Swollen glands?: No Easy bruising?: No  Cardiovascular Leg swelling?: No Chest pain?: No  Respiratory Cough?: No Shortness of breath?: No  Endocrine Excessive thirst?: No  Musculoskeletal Back pain?: No Joint pain?: No  Neurological Headaches?: No Dizziness?: No  Psychologic Depression?: No Anxiety?: No  Physical Exam: BP 128/67   Pulse 85   Ht 5\' 11"  (1.803 m)   Wt 181 lb (82.1 kg)   BMI 25.24 kg/m   Constitutional:  Alert and oriented, No acute distress. HEENT: [place] AT, moist mucus membranes.  Trachea midline, no masses. Cardiovascular: No clubbing, cyanosis, or edema. Respiratory: Normal respiratory effort, no increased work of breathing. GI: Abdomen is soft, nontender, nondistended, no abdominal masses GU: No CVA tenderness.  Rectal: Normal sphincter tone.  30 cc prostate, no nodules, nontender.   Skin: No rashes, bruises  or suspicious lesions. Neurologic: Grossly intact, no focal deficits, moving all 4 extremities. Psychiatric: Normal mood and affect.  Laboratory Data: Lab Results  Component Value Date   WBC 14.4 (H) 08/14/2016   HGB 13.4 08/14/2016   HCT 39.0 (L) 08/14/2016   MCV 95.6 08/14/2016   PLT 134 (L) 08/14/2016    Lab Results  Component Value Date   CREATININE 0.91 08/14/2016    Pertinent Imaging: Results for orders placed or performed in visit on 04/09/17  BLADDER SCAN AMB NON-IMAGING  Result Value Ref Range   Scan Result 72ml     Assessment & Plan:    1. BPH with obstruction/lower urinary tract symptoms Doing well s/p TURP Prostate cancer screening up-to-date, recent PSA low and rectal exam today unremarkable At this point time, I have advised him to follow up as needed  if his symptoms recur or if he becomes bothered, he is agreeable to this plan - BLADDER SCAN AMB NON-IMAGING   Return if symptoms worsen or fail to improve.  Hollice Espy, MD  [facility] [address] [place], [place] [address] [phone]

## 2017-05-24 DIAGNOSIS — H1851 Endothelial corneal dystrophy: Secondary | ICD-10-CM | POA: Diagnosis not present

## 2017-06-14 DIAGNOSIS — H1851 Endothelial corneal dystrophy: Secondary | ICD-10-CM | POA: Diagnosis not present

## 2017-09-02 DIAGNOSIS — H1820 Unspecified corneal edema: Secondary | ICD-10-CM | POA: Diagnosis not present

## 2017-09-02 DIAGNOSIS — Z961 Presence of intraocular lens: Secondary | ICD-10-CM | POA: Diagnosis not present

## 2017-09-02 DIAGNOSIS — H43813 Vitreous degeneration, bilateral: Secondary | ICD-10-CM | POA: Diagnosis not present

## 2017-09-02 DIAGNOSIS — H1851 Endothelial corneal dystrophy: Secondary | ICD-10-CM | POA: Diagnosis not present

## 2017-10-01 DIAGNOSIS — T86848 Other complications of corneal transplant: Secondary | ICD-10-CM | POA: Diagnosis not present

## 2017-10-01 DIAGNOSIS — H1851 Endothelial corneal dystrophy: Secondary | ICD-10-CM | POA: Diagnosis not present

## 2017-10-01 DIAGNOSIS — T85328A Displacement of other ocular prosthetic devices, implants and grafts, initial encounter: Secondary | ICD-10-CM | POA: Diagnosis not present

## 2017-10-01 DIAGNOSIS — H1820 Unspecified corneal edema: Secondary | ICD-10-CM | POA: Diagnosis not present

## 2017-10-04 DIAGNOSIS — H1851 Endothelial corneal dystrophy: Secondary | ICD-10-CM | POA: Diagnosis not present

## 2017-10-04 DIAGNOSIS — T86848 Other complications of corneal transplant: Secondary | ICD-10-CM | POA: Diagnosis not present

## 2017-10-04 DIAGNOSIS — T85328A Displacement of other ocular prosthetic devices, implants and grafts, initial encounter: Secondary | ICD-10-CM | POA: Diagnosis not present

## 2018-02-04 DIAGNOSIS — Z0000 Encounter for general adult medical examination without abnormal findings: Secondary | ICD-10-CM | POA: Diagnosis not present

## 2018-02-04 DIAGNOSIS — Z1211 Encounter for screening for malignant neoplasm of colon: Secondary | ICD-10-CM | POA: Diagnosis not present

## 2018-02-04 DIAGNOSIS — E7800 Pure hypercholesterolemia, unspecified: Secondary | ICD-10-CM | POA: Diagnosis not present

## 2018-02-04 DIAGNOSIS — Z125 Encounter for screening for malignant neoplasm of prostate: Secondary | ICD-10-CM | POA: Diagnosis not present

## 2018-02-04 DIAGNOSIS — Z8739 Personal history of other diseases of the musculoskeletal system and connective tissue: Secondary | ICD-10-CM | POA: Diagnosis not present

## 2018-02-11 DIAGNOSIS — Z125 Encounter for screening for malignant neoplasm of prostate: Secondary | ICD-10-CM | POA: Diagnosis not present

## 2018-02-12 DIAGNOSIS — Z1211 Encounter for screening for malignant neoplasm of colon: Secondary | ICD-10-CM | POA: Diagnosis not present

## 2018-03-19 DIAGNOSIS — Z961 Presence of intraocular lens: Secondary | ICD-10-CM | POA: Diagnosis not present

## 2018-03-19 DIAGNOSIS — H1851 Endothelial corneal dystrophy: Secondary | ICD-10-CM | POA: Diagnosis not present

## 2018-03-19 DIAGNOSIS — H1820 Unspecified corneal edema: Secondary | ICD-10-CM | POA: Diagnosis not present

## 2018-05-28 DIAGNOSIS — Z961 Presence of intraocular lens: Secondary | ICD-10-CM | POA: Diagnosis not present

## 2018-05-28 DIAGNOSIS — H1851 Endothelial corneal dystrophy: Secondary | ICD-10-CM | POA: Diagnosis not present

## 2018-08-21 DIAGNOSIS — H18519 Endothelial corneal dystrophy, unspecified eye: Secondary | ICD-10-CM | POA: Insufficient documentation

## 2018-08-28 DIAGNOSIS — H1851 Endothelial corneal dystrophy: Secondary | ICD-10-CM | POA: Diagnosis not present

## 2018-08-28 DIAGNOSIS — N400 Enlarged prostate without lower urinary tract symptoms: Secondary | ICD-10-CM | POA: Diagnosis not present

## 2018-09-01 DIAGNOSIS — Z961 Presence of intraocular lens: Secondary | ICD-10-CM | POA: Diagnosis not present

## 2018-09-01 DIAGNOSIS — Z48810 Encounter for surgical aftercare following surgery on the sense organs: Secondary | ICD-10-CM | POA: Diagnosis not present

## 2018-09-01 DIAGNOSIS — H1851 Endothelial corneal dystrophy: Secondary | ICD-10-CM | POA: Diagnosis not present

## 2018-10-24 DIAGNOSIS — L578 Other skin changes due to chronic exposure to nonionizing radiation: Secondary | ICD-10-CM | POA: Diagnosis not present

## 2018-10-24 DIAGNOSIS — Z85828 Personal history of other malignant neoplasm of skin: Secondary | ICD-10-CM | POA: Diagnosis not present

## 2018-10-24 DIAGNOSIS — L219 Seborrheic dermatitis, unspecified: Secondary | ICD-10-CM | POA: Diagnosis not present

## 2018-10-24 DIAGNOSIS — L570 Actinic keratosis: Secondary | ICD-10-CM | POA: Diagnosis not present

## 2018-10-24 DIAGNOSIS — L821 Other seborrheic keratosis: Secondary | ICD-10-CM | POA: Diagnosis not present

## 2018-10-28 DIAGNOSIS — R194 Change in bowel habit: Secondary | ICD-10-CM | POA: Diagnosis not present

## 2018-10-28 DIAGNOSIS — K625 Hemorrhage of anus and rectum: Secondary | ICD-10-CM | POA: Diagnosis not present

## 2018-12-16 ENCOUNTER — Ambulatory Visit: Payer: PPO | Admitting: Anesthesiology

## 2018-12-16 ENCOUNTER — Encounter
Admission: RE | Disposition: A | Discharge status: Home / Self Care | Payer: Self-pay | Source: Home / Self Care | Attending: Gastroenterology

## 2018-12-16 ENCOUNTER — Other Ambulatory Visit: Payer: Self-pay | Admitting: Gastroenterology

## 2018-12-16 ENCOUNTER — Ambulatory Visit
Admission: RE | Admit: 2018-12-16 | Discharge: 2018-12-16 | Disposition: A | Discharge status: Home / Self Care | Payer: PPO | Attending: Gastroenterology | Admitting: Gastroenterology

## 2018-12-16 DIAGNOSIS — D126 Benign neoplasm of colon, unspecified: Secondary | ICD-10-CM | POA: Insufficient documentation

## 2018-12-16 DIAGNOSIS — Z885 Allergy status to narcotic agent status: Secondary | ICD-10-CM | POA: Insufficient documentation

## 2018-12-16 DIAGNOSIS — D124 Benign neoplasm of descending colon: Secondary | ICD-10-CM | POA: Diagnosis not present

## 2018-12-16 DIAGNOSIS — K6389 Other specified diseases of intestine: Secondary | ICD-10-CM | POA: Diagnosis not present

## 2018-12-16 DIAGNOSIS — Z8042 Family history of malignant neoplasm of prostate: Secondary | ICD-10-CM | POA: Insufficient documentation

## 2018-12-16 DIAGNOSIS — Z8371 Family history of colonic polyps: Secondary | ICD-10-CM | POA: Insufficient documentation

## 2018-12-16 DIAGNOSIS — K635 Polyp of colon: Secondary | ICD-10-CM | POA: Diagnosis not present

## 2018-12-16 DIAGNOSIS — C188 Malignant neoplasm of overlapping sites of colon: Secondary | ICD-10-CM | POA: Diagnosis not present

## 2018-12-16 DIAGNOSIS — N401 Enlarged prostate with lower urinary tract symptoms: Secondary | ICD-10-CM | POA: Insufficient documentation

## 2018-12-16 DIAGNOSIS — K625 Hemorrhage of anus and rectum: Secondary | ICD-10-CM | POA: Diagnosis not present

## 2018-12-16 DIAGNOSIS — Z881 Allergy status to other antibiotic agents status: Secondary | ICD-10-CM | POA: Diagnosis not present

## 2018-12-16 DIAGNOSIS — K648 Other hemorrhoids: Secondary | ICD-10-CM | POA: Diagnosis not present

## 2018-12-16 DIAGNOSIS — Z888 Allergy status to other drugs, medicaments and biological substances status: Secondary | ICD-10-CM | POA: Insufficient documentation

## 2018-12-16 DIAGNOSIS — Z886 Allergy status to analgesic agent status: Secondary | ICD-10-CM | POA: Insufficient documentation

## 2018-12-16 DIAGNOSIS — K6289 Other specified diseases of anus and rectum: Secondary | ICD-10-CM | POA: Diagnosis not present

## 2018-12-16 DIAGNOSIS — C19 Malignant neoplasm of rectosigmoid junction: Secondary | ICD-10-CM | POA: Diagnosis not present

## 2018-12-16 DIAGNOSIS — D490 Neoplasm of unspecified behavior of digestive system: Secondary | ICD-10-CM | POA: Insufficient documentation

## 2018-12-16 DIAGNOSIS — R194 Change in bowel habit: Secondary | ICD-10-CM | POA: Diagnosis not present

## 2018-12-16 DIAGNOSIS — Z538 Procedure and treatment not carried out for other reasons: Secondary | ICD-10-CM | POA: Diagnosis not present

## 2018-12-16 DIAGNOSIS — K5669 Other partial intestinal obstruction: Secondary | ICD-10-CM | POA: Diagnosis not present

## 2018-12-16 DIAGNOSIS — D128 Benign neoplasm of rectum: Secondary | ICD-10-CM | POA: Diagnosis not present

## 2018-12-16 HISTORY — PX: COLON SURGERY: SHX602

## 2018-12-16 HISTORY — PX: COLONOSCOPY WITH PROPOFOL: SHX5780

## 2018-12-16 SURGERY — COLONOSCOPY WITH PROPOFOL
Anesthesia: General

## 2018-12-16 MED ORDER — PROPOFOL 10 MG/ML IV BOLUS
INTRAVENOUS | Status: AC
  Filled 2018-12-16: qty 20

## 2018-12-16 MED ORDER — SODIUM CHLORIDE 0.9 % IV SOLN
INTRAVENOUS | Status: DC
  Administered 2018-12-16: 1000 mL via INTRAVENOUS

## 2018-12-16 MED ORDER — PROPOFOL 10 MG/ML IV BOLUS
INTRAVENOUS | Status: DC | PRN
  Administered 2018-12-16: 70 mg via INTRAVENOUS

## 2018-12-16 MED ORDER — PROPOFOL 500 MG/50ML IV EMUL
INTRAVENOUS | Status: DC | PRN
  Administered 2018-12-16: 140 ug/kg/min via INTRAVENOUS

## 2018-12-16 NOTE — Anesthesia Post-op Follow-up Note (Signed)
Anesthesia QCDR form completed.        

## 2018-12-16 NOTE — H&P (Signed)
Outpatient short stay form Pre-procedure 12/16/2018 1:10 PM Darin Sails MD  Primary Physician: Ramonita Lab, MD  Reason for visit: Colonoscopy  History of present illness: Patient is a 73 year old male presenting today for colonoscopy in regards to complaint of some rectal bleeding.  There is a family history of colon polyps primary relative, brother.  Patient has never had a colonoscopy in the past.  He tolerated his prep well.  He takes no aspirin or blood thinning agent.    Current Facility-Administered Medications:  .  0.9 %  sodium chloride infusion, , Intravenous, Continuous, Darin Sails, MD, Last Rate: 20 mL/hr at 12/16/18 1152, 1,000 mL at 12/16/18 1152  No medications prior to admission.     Allergies  Allergen Reactions  . Oxycodone-Acetaminophen     Other reaction(s): Unknown Constipation, Retention per patient  . Ciprofloxacin Diarrhea  . Alfuzosin     Other reaction(s): Dizziness  . Bactrim [Sulfamethoxazole-Trimethoprim] Other (See Comments)    GI distress  . Dutasteride     Other reaction(s): Other (See Comments) intolerant  . Tamsulosin     Other reaction(s): Dizziness     Past Medical History:  Diagnosis Date  . Benign localized hyperplasia of prostate with urinary obstruction and other lower urinary tract symptoms (LUTS) 04/15/2013  . Cancer ([place])    skin cancer  . Enlarged prostate with lower urinary tract symptoms (LUTS) 04/15/2013  . Family history of prostate cancer 04/15/2013  . History of degenerative disc disease 07/20/2016   Overview:  Status post C-spine surgery x 2, last in 1996  . Incomplete emptying of bladder 04/15/2013  . Post-void dribbling 04/15/2013  . Pure hypercholesterolemia 09/08/2014  . Retention of urine 04/15/2013    Review of systems:      Physical Exam    Heart and lungs: Regular rate and rhythm without rub or gallop lungs are bilaterally clear    HEENT: Normocephalic atraumatic eyes are anicteric    Other:   Pertinant exam for procedure: Soft nontender nondistended bowel sounds positive normoactive    Planned proceedures: Colonoscopy and indicated procedures. I have discussed the risks benefits and complications of procedures to include not limited to bleeding, infection, perforation and the risk of sedation and the patient wishes to proceed.    Darin Sails, MD Gastroenterology 12/16/2018  1:10 PM

## 2018-12-16 NOTE — Transfer of Care (Signed)
Immediate Anesthesia Transfer of Care Note  Patient: Alease Medina Kainz  Procedure(s) Performed: COLONOSCOPY WITH PROPOFOL (N/A )  Patient Location: PACU  Anesthesia Type:General  Level of Consciousness: sedated  Airway & Oxygen Therapy: Patient Spontanous Breathing and Patient connected to nasal cannula oxygen  Post-op Assessment: Report given to RN and Post -op Vital signs reviewed and stable  Post vital signs: Reviewed and stable  Last Vitals:  Vitals Value Taken Time  BP 127/83 12/16/2018  2:12 PM  Temp 36.1 C 12/16/2018  2:12 PM  Pulse 63 12/16/2018  2:18 PM  Resp 13 12/16/2018  2:18 PM  SpO2 99 % 12/16/2018  2:18 PM  Vitals shown include unvalidated device data.  Last Pain:  Vitals:   12/16/18 1412  TempSrc: Tympanic  PainSc: Asleep         Complications: No apparent anesthesia complications

## 2018-12-16 NOTE — Anesthesia Preprocedure Evaluation (Signed)
Anesthesia Evaluation  Patient identified by MRN, date of birth, ID band Patient awake    Reviewed: Allergy & Precautions, NPO status , Patient's Chart, lab work & pertinent test results  History of Anesthesia Complications Negative for: history of anesthetic complications  Airway Mallampati: I  TM Distance: >3 FB Neck ROM: Full    Dental no notable dental hx.    Pulmonary neg pulmonary ROS, neg sleep apnea, neg COPD,    breath sounds clear to auscultation- rhonchi (-) wheezing      Cardiovascular Exercise Tolerance: Good (-) hypertension(-) CAD and (-) Past MI  Rhythm:Regular Rate:Normal - Systolic murmurs and - Diastolic murmurs    Neuro/Psych negative neurological ROS  negative psych ROS   GI/Hepatic negative GI ROS, Neg liver ROS,   Endo/Other  negative endocrine ROSneg diabetes  Renal/GU negative Renal ROS     Musculoskeletal negative musculoskeletal ROS (+)   Abdominal (+) - obese,   Peds  Hematology negative hematology ROS (+)   Anesthesia Other Findings Past Medical History: 04/15/2013: Benign localized hyperplasia of prostate with urinary  obstruction and other lower urinary tract symptoms (LUTS) No date: Cancer ([place])     Comment:  skin cancer 04/15/2013: Enlarged prostate with lower urinary tract symptoms (LUTS) 04/15/2013: Family history of prostate cancer 07/20/2016: History of degenerative disc disease     Comment:  Overview:  Status post C-spine surgery x 2, last in 1996 04/15/2013: Incomplete emptying of bladder 04/15/2013: Post-void dribbling 09/08/2014: Pure hypercholesterolemia 04/15/2013: Retention of urine   Reproductive/Obstetrics                             Anesthesia Physical Anesthesia Plan  ASA: II  Anesthesia Plan: General   Post-op Pain Management:    Induction: Intravenous  PONV Risk Score and Plan: 1 and Propofol infusion  Airway Management Planned:  Natural Airway  Additional Equipment:   Intra-op Plan:   Post-operative Plan:   Informed Consent: I have reviewed the patients History and Physical, chart, labs and discussed the procedure including the risks, benefits and alternatives for the proposed anesthesia with the patient or authorized representative who has indicated his/her understanding and acceptance.     Dental advisory given  Plan Discussed with: CRNA and Anesthesiologist  Anesthesia Plan Comments:         Anesthesia Quick Evaluation

## 2018-12-17 ENCOUNTER — Telehealth: Payer: Self-pay | Admitting: *Deleted

## 2018-12-17 ENCOUNTER — Encounter: Payer: Self-pay | Admitting: Gastroenterology

## 2018-12-17 ENCOUNTER — Other Ambulatory Visit: Payer: Self-pay | Admitting: Gastroenterology

## 2018-12-17 DIAGNOSIS — K6389 Other specified diseases of intestine: Secondary | ICD-10-CM

## 2018-12-17 LAB — SURGICAL PATHOLOGY

## 2018-12-17 NOTE — Anesthesia Postprocedure Evaluation (Signed)
Anesthesia Post Note  Patient: Darin Bell  Procedure(s) Performed: COLONOSCOPY WITH PROPOFOL (N/A )  Patient location during evaluation: PACU Anesthesia Type: General Level of consciousness: awake and alert Pain management: pain level controlled Vital Signs Assessment: post-procedure vital signs reviewed and stable Respiratory status: spontaneous breathing, nonlabored ventilation and respiratory function stable Cardiovascular status: blood pressure returned to baseline and stable Postop Assessment: no apparent nausea or vomiting Anesthetic complications: no     Last Vitals:  Vitals:   12/16/18 1432 12/16/18 1442  BP: (!) 147/81 (!) 149/84  Pulse:    Resp:    Temp:    SpO2:      Last Pain:  Vitals:   12/17/18 0750  TempSrc:   PainSc: 0-No pain                 Durenda Hurt

## 2018-12-17 NOTE — Telephone Encounter (Signed)
-----   Message from Robert Bellow, MD sent at 12/16/2018  3:44 PM EST ----- Please arrange for the patient to come in on Thursday, recently diagnosed with colon cancer.

## 2018-12-17 NOTE — Telephone Encounter (Signed)
Left message for patient to call the office back to make an appointment with Dr.Byrnett

## 2018-12-17 NOTE — Op Note (Signed)
[facility] Gastroenterology Patient Name: Darin Bell Procedure Date: 12/16/2018 1:23 PM MRN: [id number] Account #: [id number] Date of Birth: 1945/12/23 Admit Type: Outpatient Age: 73 Room: [facility] ENDO ROOM 3 Gender: Male Note Status: Finalized Procedure:            Colonoscopy Indications:          Rectal bleeding, Change in bowel habits Providers:            Lollie Sails, MD Referring MD:         Ramonita Lab, MD (Referring MD) Medicines:            Monitored Anesthesia Care Complications:        No immediate complications. Procedure:            Pre-Anesthesia Assessment:                       - ASA Grade Assessment: II - A patient with mild                        systemic disease.                       After obtaining informed consent, the colonoscope was                        passed under direct vision. Throughout the procedure,                        the patient's blood pressure, pulse, and oxygen                        saturations were monitored continuously. The                        Colonoscope was introduced through the anus with the                        intention of advancing to the cecum. The scope was                        advanced to the sigmoid colon before the procedure was                        aborted. Medications were given. The colonoscopy was                        performed without difficulty. The patient tolerated the                        procedure well. The quality of the bowel preparation                        was good. Findings:      A friable, fungating partially obstructing large mass was found from 18       to 24 cm proximal to the anus. The mass was circumferential. The mass       measured longer than 6 cm in length. The scope could not be taken above       24 cm from the anal verge due to apparent malignant stenosis. Biopsies  were taken with a cold forceps for histology.      Two semi-pedunculated polyps were found at  17 cm proximal to the anus.       The polyps were 9 to 13 mm in size. These polyps were removed with a hot       snare. Resection and retrieval were complete.      A 20 mm polyp was found at 14 cm proximal to the anus. The polyp was       semi-pedunculated with a broad base. The polyp was removed with a hot       snare. Resection and retrieval were complete.      Non-bleeding internal hemorrhoids were found during retroflexion. The       hemorrhoids were medium-sized.      The digital rectal exam was normal otherwise. Impression:           - Likely malignant partially obstructing tumor from 18                        to 20 cm proximal to the anus. Biopsied.                       - Two 9 to 13 mm polyps at 17 cm proximal to the anus,                        removed with a hot snare. Resected and retrieved.                       - One 20 mm polyp at 14 cm proximal to the anus,                        removed with a hot snare. Resected and retrieved.                       - Non-bleeding internal hemorrhoids. Recommendation:       - Discharge patient to home.                       - Perform a virtual colonoscopy at appointment to be                        scheduled.                       - Low residue diet.                       - Await pathology results.                       - Refer to a colo-rectal surgeon at appointment to be                        scheduled. Procedure Code(s):    --- Professional ---                       [phone], 52, Colonoscopy, flexible; with removal of                        tumor(s), polyp(s), or other lesion(s) by snare  technique                       [id number], [id number], Colonoscopy, flexible; with biopsy,                        single or multiple Diagnosis Code(s):    --- Professional ---                       D49.0, Neoplasm of unspecified behavior of digestive                        system                       K56.690, Other partial intestinal  obstruction                       D12.6, Benign neoplasm of colon, unspecified                       K64.8, Other hemorrhoids                       K62.5, Hemorrhage of anus and rectum                       R19.4, Change in bowel habit CPT copyright 2018 American Medical Association. All rights reserved. The codes documented in this report are preliminary and upon coder review may  be revised to meet current compliance requirements. Lollie Sails, MD 12/16/2018 2:15:21 PM This report has been signed electronically. Number of Addenda: 0 Note Initiated On: 12/16/2018 1:23 PM Total Procedure Duration: 0 hours 33 minutes 43 seconds       [facility]

## 2018-12-19 ENCOUNTER — Ambulatory Visit
Admission: RE | Admit: 2018-12-19 | Discharge: 2018-12-19 | Disposition: A | Discharge status: Home / Self Care | Payer: PPO | Source: Ambulatory Visit | Attending: Gastroenterology | Admitting: Gastroenterology

## 2018-12-19 DIAGNOSIS — K6389 Other specified diseases of intestine: Secondary | ICD-10-CM

## 2018-12-19 DIAGNOSIS — K639 Disease of intestine, unspecified: Secondary | ICD-10-CM | POA: Diagnosis not present

## 2018-12-23 ENCOUNTER — Encounter: Payer: Self-pay | Admitting: General Surgery

## 2018-12-23 ENCOUNTER — Other Ambulatory Visit: Payer: Self-pay

## 2018-12-23 ENCOUNTER — Ambulatory Visit: Payer: PPO | Admitting: General Surgery

## 2018-12-23 ENCOUNTER — Other Ambulatory Visit: Payer: Self-pay | Admitting: General Surgery

## 2018-12-23 VITALS — BP 147/77 | HR 77 | Temp 97.5°F | Resp 16 | Ht 71.0 in | Wt 182.2 lb

## 2018-12-23 DIAGNOSIS — C187 Malignant neoplasm of sigmoid colon: Secondary | ICD-10-CM | POA: Diagnosis not present

## 2018-12-23 MED ORDER — NEOMYCIN SULFATE 500 MG PO TABS: ORAL_TABLET | ORAL | 0 refills | Status: DC

## 2018-12-23 MED ORDER — METRONIDAZOLE 500 MG PO TABS: ORAL_TABLET | ORAL | 0 refills | Status: DC

## 2018-12-23 MED ORDER — POLYETHYLENE GLYCOL 3350 17 GM/SCOOP PO POWD: ORAL | 0 refills | Status: DC

## 2018-12-23 NOTE — Progress Notes (Signed)
Patient ID: Darin Bell, male   DOB: [birth year], 73 y.o.   MRN: [id number]  Chief Complaint  Patient presents with  . Other     new pt ref Dr.Skulskie colon mass- CT colonoscopy 12/19/18-surgery scheduled for 12/29/18.     HPI Darin Bell is a 73 y.o. male here today for a evaluation of a  colon mass. Colonoscopy done on 12/17/2018. Scheduled for surgery on 12/29/2018. Patient is here today with is wife and 2 out of 3 of his children today.   Past Medical History:  Diagnosis Date  . Benign localized hyperplasia of prostate with urinary obstruction and other lower urinary tract symptoms (LUTS) 04/15/2013  . Cancer ([place])    skin cancer  . Enlarged prostate with lower urinary tract symptoms (LUTS) 04/15/2013  . Family history of prostate cancer 04/15/2013  . History of degenerative disc disease 07/20/2016   Overview:  Status post C-spine surgery x 2, last in 1996  . Incomplete emptying of bladder 04/15/2013  . Post-void dribbling 04/15/2013  . Pure hypercholesterolemia 09/08/2014  . Retention of urine 04/15/2013    Past Surgical History:  Procedure Laterality Date  . BACK SURGERY  1994  . COLONOSCOPY WITH PROPOFOL N/A 12/16/2018   Procedure: COLONOSCOPY WITH PROPOFOL;  Surgeon: Lollie Sails, MD;  Location: [facility] ENDOSCOPY;  Service: Endoscopy;  Laterality: N/A;  . EYE SURGERY Bilateral    Cataract Extraction with IOL  . HERNIA REPAIR Right 2015   Inguinal Hernia Repair  . TRANSURETHRAL RESECTION OF PROSTATE N/A 08/13/2016   Procedure: TRANSURETHRAL RESECTION OF THE PROSTATE (TURP);  Surgeon: Hollice Espy, MD;  Location: [facility] ORS;  Service: Urology;  Laterality: N/A;    Family History  Problem Relation Age of Onset  . Prostate cancer Father   . Cancer Father   . Diabetes Father   . Kidney cancer Neg Hx     Social History Social History   Tobacco Use  . Smoking status: Never Smoker  . Smokeless tobacco: Never Used  Substance Use Topics  . Alcohol use: No  . Drug use: No     Allergies  Allergen Reactions  . Oxycodone-Acetaminophen     Constipation, Retention per patient  . Ciprofloxacin Diarrhea  . Alfuzosin     Dizziness  . Bactrim [Sulfamethoxazole-Trimethoprim] Other (See Comments)    GI distress  . Dutasteride   . Tamsulosin     Dizziness    Current Outpatient Medications  Medication Sig Dispense Refill  . metroNIDAZOLE (FLAGYL) 500 MG tablet Take 1 tablet at 6 pm and take 1 tablet at 11 pm the evening prior to surgery. 2 tablet 0  . neomycin (MYCIFRADIN) 500 MG tablet Take 2 tablets at 6 pm, then take 2 tablets at 11 pm the evening prior to surgery. 4 tablet 0  . polyethylene glycol powder (GLYCOLAX/MIRALAX) powder At 5 pm the day before surgery, mix whole container with 64 ounces of clear liquids, No Red liquids. Drink 8oz every 15 minutes until gone. 255 g 0  . prednisoLONE acetate (PRED FORTE) 1 % ophthalmic suspension Place 1 drop into the left eye 2 (two) times daily.     No current facility-administered medications for this visit.     Review of Systems Review of Systems  Constitutional: Negative.  Negative for unexpected weight change.  HENT: Negative.   Respiratory: Negative.   Cardiovascular: Negative.   Gastrointestinal:       The patient reports over the past 4 months his stools have changed  in caliber and he has had to strain with bowel movements.  This is occasionally associated with a spot of bright blood on the toilet tissue.  Endocrine: Negative.   Genitourinary: Positive for urgency (Occasional urgency, less frequent small stream.).  Allergic/Immunologic: Negative.   Neurological: Negative.   Hematological: Negative.     Blood pressure (!) 147/77, pulse 77, temperature (!) 97.5 F (36.4 C), temperature source Temporal, resp. rate 16, height 5\' 11"  (1.803 m), weight 182 lb 3.2 oz (82.6 kg), SpO2 97 %.  Physical Exam Physical Exam Constitutional:      Appearance: He is well-developed.  HENT:     Head:  Normocephalic.  Eyes:     General: No scleral icterus.    Conjunctiva/sclera: Conjunctivae normal.     Pupils: Pupils are equal, round, and reactive to light.  Neck:     Musculoskeletal: Normal range of motion.  Cardiovascular:     Rate and Rhythm: Normal rate and regular rhythm.     Heart sounds: Normal heart sounds.  Pulmonary:     Effort: Pulmonary effort is normal.     Breath sounds: Normal breath sounds.  Chest:     Breasts: Breasts are symmetrical.        Right: No inverted nipple, mass, nipple discharge, skin change or tenderness.        Left: No inverted nipple, mass, nipple discharge, skin change or tenderness.  Abdominal:     General: Abdomen is flat. Bowel sounds are normal.     Palpations: Abdomen is soft. There is no hepatomegaly, splenomegaly or mass.     Tenderness: There is no abdominal tenderness.  Musculoskeletal: Normal range of motion.  Lymphadenopathy:     Cervical: No cervical adenopathy.     Upper Body:     Left upper body: No supraclavicular adenopathy.     Lower Body: No right inguinal adenopathy. No left inguinal adenopathy.  Skin:    General: Skin is warm and dry.  Neurological:     General: No focal deficit present.     Mental Status: He is alert and oriented to person, place, and time.  Psychiatric:        Mood and Affect: Mood normal.        Behavior: Behavior normal.        Thought Content: Thought content normal.        Judgment: Judgment normal.     Data Reviewed Colonoscopy of December 16, 2018 impression: - Likely malignant partially obstructing tumor from 18 to 20 cm proximal to the anus. Biopsied. - Two 9 to 13 mm polyps at 17 cm proximal to the anus, removed with a hot snare. Resected and retrieved. - One 20 mm polyp at 14 cm proximal   DIAGNOSIS:  A. COLON MASS, 17-24 CM; COLD BIOPSY:  - INVASIVE COLORECTAL ADENOCARCINOMA, MODERATELY DIFFERENTIATED.   B. COLON POLYP X2, AT 17 CM; HOT SNARE:  - SESSILE SERRATED POLYP (1).  -  SERRATED POLYP, CANNOT EXCLUDE EARLY SESSILE SERRATED POLYP (1).  - NEGATIVE FOR DYSPLASIA AND MALIGNANCY.   C. COLON POLYP, AT 14 CM; HOT SNARE:  - TUBULAR ADENOMA.  - NEGATIVE FOR HIGH-GRADE DYSPLASIA AND MALIGNANCY.   Virtual colonoscopy of December 19, 2018: (These films were reviewed)  IMPRESSION: Hepatobiliary: No focal hepatic abnormality. Gallbladder Unremarkable. Approximately 5 cm apple core circumferential annular lesion within the mid sigmoid colon most compatible with colon cancer. There are several small adjacent mesenteric lymph nodes, 4-5 mm in size. Based on proximity  and number of these, these are suspicious. No other visible colonic lesions.  CBC of October 28, 2018: Hemoglobin of 14.6, minimally changed from 15.1 in November 2016.  MCV 93.8, unchanged.  White blood cell count 6500.  Platelet count of 184,000.   Assessment Carcinoma sigmoid colon. No evidence of metastatic disease.  Plan Indications for colon resection reviewed.  CEA and liver function studies will be obtained.  The tight stenosis at the area of the tumor warrants early rather than late surgical intervention.  The patient was advised that the CT scan was not tailored directly for metastatic disease and he will likely require a postoperative CT.  Risks of surgery including bleeding, infection and potential need for stoma (unlikely) were reviewed with the patient, wife and 2 of 3 children.  Bowel prep reviewed with the patient.  Preoperative antibiotics discussed.  Importance of early ambulation reviewed.  HPI, Physical Exam, Assessment and Plan have been scribed under the direction and in the presence of Hervey Ard, Md.  Eudelia Bunch R. Bobette Mo, CMA  I have completed the exam and reviewed the above documentation for accuracy and completeness.  I agree with the above.  Haematologist has been used and any errors in dictation or transcription are unintentional.  Hervey Ard, M.D.,  F.A.C.S.  Forest Gleason Elzie Sheets 12/24/2018, 10:06 AM

## 2018-12-23 NOTE — Patient Instructions (Addendum)
Patient will need to return to the office as scheduled.   Call the office with any questions or concerns.

## 2018-12-24 DIAGNOSIS — C187 Malignant neoplasm of sigmoid colon: Secondary | ICD-10-CM | POA: Insufficient documentation

## 2018-12-25 ENCOUNTER — Encounter
Admission: RE | Admit: 2018-12-25 | Discharge: 2018-12-25 | Disposition: A | Discharge status: Home / Self Care | Payer: PPO | Source: Ambulatory Visit | Attending: General Surgery | Admitting: General Surgery

## 2018-12-25 ENCOUNTER — Other Ambulatory Visit: Payer: Self-pay

## 2018-12-25 DIAGNOSIS — Z01818 Encounter for other preprocedural examination: Secondary | ICD-10-CM | POA: Diagnosis not present

## 2018-12-25 DIAGNOSIS — Z01810 Encounter for preprocedural cardiovascular examination: Secondary | ICD-10-CM | POA: Diagnosis not present

## 2018-12-25 LAB — CBC WITH DIFFERENTIAL/PLATELET
Abs Immature Granulocytes: 0.02 10*3/uL (ref 0.00–0.07)
BASOS PCT: 1 %
Basophils Absolute: 0 10*3/uL (ref 0.0–0.1)
Eosinophils Absolute: 0.3 10*3/uL (ref 0.0–0.5)
Eosinophils Relative: 5 %
HCT: 39.8 % (ref 39.0–52.0)
Hemoglobin: 13.7 g/dL (ref 13.0–17.0)
Immature Granulocytes: 0 %
Lymphocytes Relative: 50 %
Lymphs Abs: 2.6 10*3/uL (ref 0.7–4.0)
MCH: 32.1 pg (ref 26.0–34.0)
MCHC: 34.4 g/dL (ref 30.0–36.0)
MCV: 93.2 fL (ref 80.0–100.0)
MONO ABS: 0.4 10*3/uL (ref 0.1–1.0)
MONOS PCT: 7 %
Neutro Abs: 2 10*3/uL (ref 1.7–7.7)
Neutrophils Relative %: 37 %
Platelets: 169 10*3/uL (ref 150–400)
RBC: 4.27 MIL/uL (ref 4.22–5.81)
RDW: 12.1 % (ref 11.5–15.5)
WBC: 5.3 10*3/uL (ref 4.0–10.5)
nRBC: 0 % (ref 0.0–0.2)

## 2018-12-25 LAB — TYPE AND SCREEN
ABO/RH(D): A NEG
Antibody Screen: NEGATIVE

## 2018-12-25 LAB — COMPREHENSIVE METABOLIC PANEL
ALT: 25 U/L (ref 0–44)
ANION GAP: 7 (ref 5–15)
AST: 25 U/L (ref 15–41)
Albumin: 3.9 g/dL (ref 3.5–5.0)
Alkaline Phosphatase: 55 U/L (ref 38–126)
BUN: 21 mg/dL (ref 8–23)
CO2: 26 mmol/L (ref 22–32)
Calcium: 8.9 mg/dL (ref 8.9–10.3)
Chloride: 107 mmol/L (ref 98–111)
Creatinine, Ser: 0.83 mg/dL (ref 0.61–1.24)
GFR calc Af Amer: >60 mL/min (ref >60)
GFR calc non Af Amer: >60 mL/min (ref >60)
GLUCOSE: 102 mg/dL — AB (ref 70–99)
Potassium: 3.9 mmol/L (ref 3.5–5.1)
Sodium: 140 mmol/L (ref 135–145)
Total Bilirubin: 1.3 mg/dL — ABNORMAL HIGH (ref 0.3–1.2)
Total Protein: 6.8 g/dL (ref 6.5–8.1)

## 2018-12-25 NOTE — Patient Instructions (Signed)
Your procedure is scheduled on: 12/29/18 Report to Day Surgery. [facility] SECOND FLOOR To find out your arrival time please call [phone] between 1PM - 3PM on 12/26/18 Remember: Instructions that are not followed completely may result in serious medical risk,  up to and including death, or upon the discretion of your surgeon and anesthesiologist your  surgery may need to be rescheduled.     _X__ 1. Do not eat food after midnight the night before your procedure.                 No gum chewing or hard candies. You may drink clear liquids up to 2 hours                 before you are scheduled to arrive for your surgery- DO not drink clear                 liquids within 2 hours of the start of your surgery.                 Clear Liquids include:  water, apple juice without pulp, clear carbohydrate                 drink such as Clearfast of Gatorade, Black Coffee or Tea (Do not add                 anything to coffee or tea).  __X__2.  On the morning of surgery brush your teeth with toothpaste and water, you                may rinse your mouth with mouthwash if you wish.  Do not swallow any toothpaste of mouthwash.     _X__ 3.  No Alcohol for 24 hours before or after surgery.   _X__ 4.  Do Not Smoke or use e-cigarettes For 24 Hours Prior to Your Surgery.                 Do not use any chewable tobacco products for at least 6 hours prior to                 surgery.  ____  5.  Bring all medications with you on the day of surgery if instructed.   __X__  6.  Notify your doctor if there is any change in your medical condition      (cold, fever, infections).     Do not wear jewelry, make-up, hairpins, clips or nail polish. Do not wear lotions, powders, or perfumes. You may wear deodorant. Do not shave 48 hours prior to surgery. Men may shave face and neck. Do not bring valuables to the hospital.    [facility] is not responsible for any belongings or  valuables.  Contacts, dentures or bridgework may not be worn into surgery. Leave your suitcase in the car. After surgery it may be brought to your room. For patients admitted to the hospital, discharge time is determined by your treatment team.   Patients discharged the day of surgery will not be allowed to drive home.   Please read over the following fact sheets that you were given:   Surgical Site Infection Prevention          ____ Take these medicines the morning of surgery with A SIP OF WATER:    1. NONE EXCEPT AS INSTRUCTED BY DR BYRNETT  2.   3.   4.  5.  6.  ____ Nile Dear  Enema (as directed)   __X__ Use CHG Soap as directed  ____ Use inhalers on the day of surgery  ____ Stop metformin 2 days prior to surgery    ____ Take 1/2 of usual insulin dose the night before surgery. No insulin the morning          of surgery.   ____ Stop Coumadin/Plavix/aspirin on   ____ Stop Anti-inflammatories on    ____ Stop supplements until after surgery.    ____ Bring C-Pap to the hospital.

## 2018-12-26 LAB — CEA: CEA: 2.8 ng/mL (ref 0.0–4.7)

## 2018-12-28 MED ORDER — SODIUM CHLORIDE 0.9 % IV SOLN
1.0000 g | INTRAVENOUS | Status: AC
  Administered 2018-12-29: 1 g via INTRAVENOUS
  Filled 2018-12-28: qty 1

## 2018-12-29 ENCOUNTER — Other Ambulatory Visit: Payer: Self-pay

## 2018-12-29 ENCOUNTER — Encounter
Admission: RE | Disposition: A | Discharge status: Home / Self Care | Payer: Self-pay | Source: Home / Self Care | Attending: General Surgery

## 2018-12-29 ENCOUNTER — Inpatient Hospital Stay: Payer: PPO | Admitting: Anesthesiology

## 2018-12-29 ENCOUNTER — Encounter: Payer: Self-pay | Admitting: *Deleted

## 2018-12-29 ENCOUNTER — Inpatient Hospital Stay
Admission: RE | Admit: 2018-12-29 | Discharge: 2018-12-31 | DRG: 331 | Disposition: A | Discharge status: Home / Self Care | Payer: PPO | Attending: General Surgery | Admitting: General Surgery

## 2018-12-29 DIAGNOSIS — Z86010 Personal history of colonic polyps: Secondary | ICD-10-CM

## 2018-12-29 DIAGNOSIS — Z85828 Personal history of other malignant neoplasm of skin: Secondary | ICD-10-CM

## 2018-12-29 DIAGNOSIS — Z9079 Acquired absence of other genital organ(s): Secondary | ICD-10-CM

## 2018-12-29 DIAGNOSIS — C187 Malignant neoplasm of sigmoid colon: Secondary | ICD-10-CM | POA: Diagnosis not present

## 2018-12-29 DIAGNOSIS — Z888 Allergy status to other drugs, medicaments and biological substances status: Secondary | ICD-10-CM | POA: Diagnosis not present

## 2018-12-29 DIAGNOSIS — Z881 Allergy status to other antibiotic agents status: Secondary | ICD-10-CM

## 2018-12-29 DIAGNOSIS — Z885 Allergy status to narcotic agent status: Secondary | ICD-10-CM

## 2018-12-29 DIAGNOSIS — Z8042 Family history of malignant neoplasm of prostate: Secondary | ICD-10-CM

## 2018-12-29 DIAGNOSIS — Z882 Allergy status to sulfonamides status: Secondary | ICD-10-CM

## 2018-12-29 DIAGNOSIS — C189 Malignant neoplasm of colon, unspecified: Secondary | ICD-10-CM | POA: Diagnosis not present

## 2018-12-29 HISTORY — PX: LAPAROSCOPIC SIGMOID COLECTOMY: SHX5928

## 2018-12-29 HISTORY — DX: Malignant neoplasm of sigmoid colon: C18.7

## 2018-12-29 SURGERY — COLECTOMY, SIGMOID, LAPAROSCOPIC
Anesthesia: General

## 2018-12-29 MED ORDER — SUGAMMADEX SODIUM 200 MG/2ML IV SOLN
INTRAVENOUS | Status: DC | PRN
  Administered 2018-12-29: 200 mg via INTRAVENOUS

## 2018-12-29 MED ORDER — FENTANYL CITRATE (PF) 250 MCG/5ML IJ SOLN
INTRAMUSCULAR | Status: AC
  Filled 2018-12-29: qty 5

## 2018-12-29 MED ORDER — ONDANSETRON HCL 4 MG/2ML IJ SOLN
INTRAMUSCULAR | Status: AC
  Filled 2018-12-29: qty 2

## 2018-12-29 MED ORDER — ROCURONIUM BROMIDE 100 MG/10ML IV SOLN
INTRAVENOUS | Status: DC | PRN
  Administered 2018-12-29 (×3): 10 mg via INTRAVENOUS
  Administered 2018-12-29: 50 mg via INTRAVENOUS

## 2018-12-29 MED ORDER — GLYCOPYRROLATE 0.2 MG/ML IJ SOLN
INTRAMUSCULAR | Status: DC | PRN
  Administered 2018-12-29: 0.2 mg via INTRAVENOUS

## 2018-12-29 MED ORDER — ROCURONIUM BROMIDE 50 MG/5ML IV SOLN
INTRAVENOUS | Status: AC
  Filled 2018-12-29: qty 1

## 2018-12-29 MED ORDER — LIDOCAINE HCL (CARDIAC) PF 100 MG/5ML IV SOSY
PREFILLED_SYRINGE | INTRAVENOUS | Status: DC | PRN
  Administered 2018-12-29: 100 mg via INTRAVENOUS

## 2018-12-29 MED ORDER — FAMOTIDINE 20 MG PO TABS
20.0000 mg | ORAL_TABLET | Freq: Once | ORAL | Status: AC
  Administered 2018-12-29: 20 mg via ORAL

## 2018-12-29 MED ORDER — GABAPENTIN 300 MG PO CAPS
ORAL_CAPSULE | ORAL | Status: AC
  Administered 2018-12-29: 300 mg via ORAL
  Filled 2018-12-29: qty 1

## 2018-12-29 MED ORDER — MIDAZOLAM HCL 2 MG/2ML IJ SOLN
INTRAMUSCULAR | Status: DC | PRN
  Administered 2018-12-29: 2 mg via INTRAVENOUS

## 2018-12-29 MED ORDER — ALVIMOPAN 12 MG PO CAPS
12.0000 mg | ORAL_CAPSULE | ORAL | Status: AC
  Administered 2018-12-29: 12 mg via ORAL

## 2018-12-29 MED ORDER — PROMETHAZINE HCL 25 MG/ML IJ SOLN: 6.2500 mg | INTRAMUSCULAR | Status: DC | PRN

## 2018-12-29 MED ORDER — SUGAMMADEX SODIUM 200 MG/2ML IV SOLN
INTRAVENOUS | Status: AC
  Filled 2018-12-29: qty 2

## 2018-12-29 MED ORDER — ALVIMOPAN 12 MG PO CAPS
ORAL_CAPSULE | ORAL | Status: AC
  Filled 2018-12-29: qty 1

## 2018-12-29 MED ORDER — BUPIVACAINE-EPINEPHRINE 0.5% -1:200000 IJ SOLN
INTRAMUSCULAR | Status: DC | PRN
  Administered 2018-12-29: 30 mL

## 2018-12-29 MED ORDER — OXYCODONE HCL 5 MG PO TABS
5.0000 mg | ORAL_TABLET | ORAL | Status: DC | PRN
  Administered 2018-12-29: 5 mg via ORAL
  Filled 2018-12-29: qty 1

## 2018-12-29 MED ORDER — MEPERIDINE HCL 50 MG/ML IJ SOLN: 6.2500 mg | INTRAMUSCULAR | Status: DC | PRN

## 2018-12-29 MED ORDER — ESMOLOL HCL 100 MG/10ML IV SOLN
INTRAVENOUS | Status: DC | PRN
  Administered 2018-12-29: 20 mg via INTRAVENOUS

## 2018-12-29 MED ORDER — ACETAMINOPHEN 10 MG/ML IV SOLN
1000.0000 mg | Freq: Four times a day (QID) | INTRAVENOUS | Status: DC
  Administered 2018-12-29 – 2018-12-30 (×2): 1000 mg via INTRAVENOUS
  Filled 2018-12-29 (×3): qty 100

## 2018-12-29 MED ORDER — PREDNISOLONE ACETATE 1 % OP SUSP
1.0000 [drp] | Freq: Two times a day (BID) | OPHTHALMIC | Status: DC
  Administered 2018-12-29 – 2018-12-31 (×4): 1 [drp] via OPHTHALMIC
  Filled 2018-12-29: qty 1

## 2018-12-29 MED ORDER — DEXAMETHASONE SODIUM PHOSPHATE 10 MG/ML IJ SOLN
INTRAMUSCULAR | Status: AC
  Filled 2018-12-29: qty 1

## 2018-12-29 MED ORDER — FENTANYL CITRATE (PF) 100 MCG/2ML IJ SOLN
25.0000 ug | INTRAMUSCULAR | Status: DC | PRN
  Administered 2018-12-29 (×2): 25 ug via INTRAVENOUS

## 2018-12-29 MED ORDER — IBUPROFEN 200 MG PO TABS
200.0000 mg | ORAL_TABLET | ORAL | Status: DC | PRN
  Administered 2018-12-30 – 2018-12-31 (×4): 200 mg via ORAL
  Filled 2018-12-29 (×6): qty 1

## 2018-12-29 MED ORDER — GABAPENTIN 300 MG PO CAPS
300.0000 mg | ORAL_CAPSULE | ORAL | Status: AC
  Administered 2018-12-29: 300 mg via ORAL

## 2018-12-29 MED ORDER — DEXAMETHASONE SODIUM PHOSPHATE 10 MG/ML IJ SOLN
INTRAMUSCULAR | Status: DC | PRN
  Administered 2018-12-29: 10 mg via INTRAVENOUS

## 2018-12-29 MED ORDER — PROPOFOL 10 MG/ML IV BOLUS
INTRAVENOUS | Status: DC | PRN
  Administered 2018-12-29: 140 mg via INTRAVENOUS

## 2018-12-29 MED ORDER — ESMOLOL HCL 100 MG/10ML IV SOLN
INTRAVENOUS | Status: AC
  Filled 2018-12-29: qty 10

## 2018-12-29 MED ORDER — KETOROLAC TROMETHAMINE 30 MG/ML IJ SOLN
INTRAMUSCULAR | Status: AC
  Filled 2018-12-29: qty 1

## 2018-12-29 MED ORDER — FAMOTIDINE 20 MG PO TABS
ORAL_TABLET | ORAL | Status: AC
  Administered 2018-12-29: 20 mg via ORAL
  Filled 2018-12-29: qty 1

## 2018-12-29 MED ORDER — KETOROLAC TROMETHAMINE 15 MG/ML IJ SOLN
INTRAMUSCULAR | Status: DC | PRN
  Administered 2018-12-29: 15 mg via INTRAVENOUS

## 2018-12-29 MED ORDER — ONDANSETRON HCL 4 MG/2ML IJ SOLN
INTRAMUSCULAR | Status: DC | PRN
  Administered 2018-12-29: 4 mg via INTRAVENOUS

## 2018-12-29 MED ORDER — FENTANYL CITRATE (PF) 100 MCG/2ML IJ SOLN
INTRAMUSCULAR | Status: DC | PRN
  Administered 2018-12-29 (×5): 50 ug via INTRAVENOUS

## 2018-12-29 MED ORDER — ALVIMOPAN 12 MG PO CAPS
12.0000 mg | ORAL_CAPSULE | Freq: Two times a day (BID) | ORAL | Status: DC
  Administered 2018-12-29 – 2018-12-30 (×3): 12 mg via ORAL
  Filled 2018-12-29 (×5): qty 1

## 2018-12-29 MED ORDER — MORPHINE SULFATE (PF) 4 MG/ML IV SOLN
2.0000 mg | INTRAVENOUS | Status: DC | PRN
  Administered 2018-12-29 – 2018-12-30 (×3): 2 mg via INTRAVENOUS
  Filled 2018-12-29 (×3): qty 1

## 2018-12-29 MED ORDER — LACTATED RINGERS IV SOLN
INTRAVENOUS | Status: DC
  Administered 2018-12-29 (×2): via INTRAVENOUS

## 2018-12-29 MED ORDER — DEXTROSE IN LACTATED RINGERS 5 % IV SOLN
INTRAVENOUS | Status: DC
  Administered 2018-12-29: 19:00:00 via INTRAVENOUS

## 2018-12-29 MED ORDER — PHENYLEPHRINE HCL 10 MG/ML IJ SOLN
INTRAMUSCULAR | Status: DC | PRN
  Administered 2018-12-29 (×4): 100 ug via INTRAVENOUS

## 2018-12-29 MED ORDER — MIDAZOLAM HCL 2 MG/2ML IJ SOLN
INTRAMUSCULAR | Status: AC
  Filled 2018-12-29: qty 2

## 2018-12-29 MED ORDER — DEXMEDETOMIDINE HCL IN NACL 200 MCG/50ML IV SOLN
INTRAVENOUS | Status: DC | PRN
  Administered 2018-12-29 (×3): 12 ug via INTRAVENOUS

## 2018-12-29 MED ORDER — SODIUM CHLORIDE 0.9 % IV SOLN
INTRAVENOUS | Status: DC | PRN
  Administered 2018-12-29: 25 ug/min via INTRAVENOUS

## 2018-12-29 MED ORDER — FENTANYL CITRATE (PF) 100 MCG/2ML IJ SOLN
INTRAMUSCULAR | Status: AC
  Administered 2018-12-29: 25 ug via INTRAVENOUS
  Filled 2018-12-29: qty 2

## 2018-12-29 MED ORDER — ONDANSETRON HCL 4 MG PO TABS
4.0000 mg | ORAL_TABLET | ORAL | Status: DC | PRN
  Administered 2018-12-29 – 2018-12-30 (×2): 4 mg via ORAL
  Filled 2018-12-29 (×2): qty 1

## 2018-12-29 MED ORDER — ENOXAPARIN SODIUM 40 MG/0.4ML ~~LOC~~ SOLN
40.0000 mg | SUBCUTANEOUS | Status: DC
  Administered 2018-12-30 – 2018-12-31 (×2): 40 mg via SUBCUTANEOUS
  Filled 2018-12-29 (×2): qty 0.4

## 2018-12-29 MED ORDER — ACETAMINOPHEN 10 MG/ML IV SOLN
INTRAVENOUS | Status: DC | PRN
  Administered 2018-12-29: 1000 mg via INTRAVENOUS

## 2018-12-29 MED ORDER — ACETAMINOPHEN 10 MG/ML IV SOLN
INTRAVENOUS | Status: AC
  Filled 2018-12-29: qty 100

## 2018-12-29 SURGICAL SUPPLY — 88 items
APPLIER CLIP ROT 10 11.4 M/L (STAPLE)
BAG COUNTER SPONGE EZ (MISCELLANEOUS) IMPLANT
BENZOIN TINCTURE PRP APPL 2/3 (GAUZE/BANDAGES/DRESSINGS) ×3 IMPLANT
BLADE SURG 10 STRL SS SAFETY (BLADE) ×3 IMPLANT
BLADE SURG 11 STRL SS SAFETY (MISCELLANEOUS) ×3 IMPLANT
CANISTER SUCT 1200ML W/VALVE (MISCELLANEOUS) ×3 IMPLANT
CANNULA DILATOR  5MM W/SLV (CANNULA) ×2
CANNULA DILATOR 10 W/SLV (CANNULA) ×2 IMPLANT
CANNULA DILATOR 10MM W/SLV (CANNULA) ×1
CANNULA DILATOR 5 W/SLV (CANNULA) ×4 IMPLANT
CHLORAPREP W/TINT 26 (MISCELLANEOUS) ×3 IMPLANT
CLEANER CAUTERY TIP 5X5 PAD (MISCELLANEOUS) IMPLANT
CLIP APPLIE ROT 10 11.4 M/L (STAPLE) IMPLANT
CLOSURE WOUND 1/2 X4 (GAUZE/BANDAGES/DRESSINGS) ×2
COUNTER SPONGE BAG EZ (MISCELLANEOUS)
COVER CLAMP SIL LG PBX B (MISCELLANEOUS) IMPLANT
COVER MAYO STAND STRL (DRAPES) ×3 IMPLANT
COVER WAND RF STERILE (DRAPES) IMPLANT
DRAIN PENROSE 5/8X18 LTX STRL (WOUND CARE) ×3 IMPLANT
DRAPE LAP W/FLUID (DRAPES) IMPLANT
DRAPE SHEET LG 3/4 BI-LAMINATE (DRAPES) ×3 IMPLANT
DRAPE UNDER BUTTOCK W/FLU (DRAPES) ×3 IMPLANT
DRSG OPSITE POSTOP 3X4 (GAUZE/BANDAGES/DRESSINGS) ×6 IMPLANT
DRSG OPSITE POSTOP 4X10 (GAUZE/BANDAGES/DRESSINGS) IMPLANT
DRSG OPSITE POSTOP 4X8 (GAUZE/BANDAGES/DRESSINGS) ×3 IMPLANT
DRSG TEGADERM 4X4.75 (GAUZE/BANDAGES/DRESSINGS) IMPLANT
DRSG TELFA 3X8 NADH (GAUZE/BANDAGES/DRESSINGS) IMPLANT
ELECT BLADE 6.5 EXT (BLADE) ×3 IMPLANT
ELECT CAUTERY BLADE 6.4 (BLADE) ×3 IMPLANT
ELECT REM PT RETURN 9FT ADLT (ELECTROSURGICAL) ×3
ELECTRODE REM PT RTRN 9FT ADLT (ELECTROSURGICAL) ×1 IMPLANT
GLOVE BIO SURGEON STRL SZ7 (GLOVE) ×18 IMPLANT
GLOVE BIO SURGEON STRL SZ7.5 (GLOVE) ×18 IMPLANT
GLOVE INDICATOR 8.0 STRL GRN (GLOVE) ×18 IMPLANT
GOWN STRL REUS W/ TWL LRG LVL3 (GOWN DISPOSABLE) ×6 IMPLANT
GOWN STRL REUS W/TWL LRG LVL3 (GOWN DISPOSABLE) ×12
HANDLE YANKAUER SUCT BULB TIP (MISCELLANEOUS) ×3 IMPLANT
IRRIGATION STRYKERFLOW (MISCELLANEOUS) IMPLANT
IRRIGATOR STRYKERFLOW (MISCELLANEOUS)
IV LACTATED RINGERS 1000ML (IV SOLUTION) IMPLANT
KIT PINK PAD W/HEAD ARE REST (MISCELLANEOUS) ×3
KIT PINK PAD W/HEAD ARM REST (MISCELLANEOUS) ×1 IMPLANT
KIT TURNOVER CYSTO (KITS) ×3 IMPLANT
LABEL OR SOLS (LABEL) ×3 IMPLANT
LIGASURE LAP MARYLAND 5MM 37CM (ELECTROSURGICAL) ×3 IMPLANT
NDL INSUFF ACCESS 14 VERSASTEP (NEEDLE) ×3 IMPLANT
NEEDLE HYPO 22GX1.5 SAFETY (NEEDLE) ×3 IMPLANT
NS IRRIG 500ML POUR BTL (IV SOLUTION) ×3 IMPLANT
PACK COLON CLEAN CLOSURE (MISCELLANEOUS) ×3 IMPLANT
PACK LAP CHOLECYSTECTOMY (MISCELLANEOUS) ×3 IMPLANT
PAD CLEANER CAUTERY TIP 5X5 (MISCELLANEOUS)
PAD PREP 24X41 OB/GYN DISP (PERSONAL CARE ITEMS) ×3 IMPLANT
PENCIL ELECTRO HAND CTR (MISCELLANEOUS) ×3 IMPLANT
RETRACTOR WND ALEXIS-O 25 LRG (MISCELLANEOUS) IMPLANT
RETRACTOR WOUND ALXS 18CM MED (MISCELLANEOUS) ×1 IMPLANT
RTRCTR WOUND ALEXIS O 18CM MED (MISCELLANEOUS) ×3
RTRCTR WOUND ALEXIS O 25CM LRG (MISCELLANEOUS)
SCISSORS METZENBAUM CVD 33 (INSTRUMENTS) IMPLANT
SET YANKAUER POOLE SUCT (MISCELLANEOUS) ×3 IMPLANT
SHEARS HARMONIC ACE PLUS 36CM (ENDOMECHANICALS) IMPLANT
SPONGE LAP 18X18 RF (DISPOSABLE) ×3 IMPLANT
STAPLER CIRCULAR 29MM (STAPLE) IMPLANT
STAPLER ENDO ILS CVD 18 33 (STAPLE) IMPLANT
STAPLER PROX 25M (MISCELLANEOUS) IMPLANT
STAPLER PROXIMATE 75MM BLUE (STAPLE) ×3 IMPLANT
STRIP CLOSURE SKIN 1/2X4 (GAUZE/BANDAGES/DRESSINGS) ×4 IMPLANT
SUT MAXON ABS #0 GS21 30IN (SUTURE) ×6 IMPLANT
SUT PROLENE 3 0 SH DA (SUTURE) IMPLANT
SUT SILK 2 0 (SUTURE) ×4
SUT SILK 2 0 SH (SUTURE) ×3 IMPLANT
SUT SILK 2-0 18XBRD TIE 12 (SUTURE) ×1 IMPLANT
SUT SILK 2-0 30XBRD TIE 12 (SUTURE) ×1 IMPLANT
SUT SILK 3-0 (SUTURE) ×6 IMPLANT
SUT VIC AB 2-0 BRD 54 (SUTURE) ×3 IMPLANT
SUT VIC AB 2-0 CT1 27 (SUTURE) ×2
SUT VIC AB 2-0 CT1 TAPERPNT 27 (SUTURE) ×1 IMPLANT
SUT VIC AB 3-0 54X BRD REEL (SUTURE) ×1 IMPLANT
SUT VIC AB 3-0 BRD 54 (SUTURE) ×2
SUT VIC AB 3-0 SH 27 (SUTURE) ×6
SUT VIC AB 3-0 SH 27X BRD (SUTURE) ×3 IMPLANT
SUT VIC AB 4-0 FS2 27 (SUTURE) ×3 IMPLANT
SYR 10ML LL (SYRINGE) ×3 IMPLANT
SYR BULB IRRIG 60ML STRL (SYRINGE) ×3 IMPLANT
TOWEL OR 17X26 4PK STRL BLUE (TOWEL DISPOSABLE) ×3 IMPLANT
TRAY FOLEY MTR SLVR 16FR STAT (SET/KITS/TRAYS/PACK) ×3 IMPLANT
TROCAR XCEL NON-BLD 11X100MML (ENDOMECHANICALS) ×3 IMPLANT
TROCAR XCEL UNIV SLVE 11M 100M (ENDOMECHANICALS) IMPLANT
TUBING EVAC SMOKE HEATED PNEUM (TUBING) ×3 IMPLANT

## 2018-12-29 NOTE — Anesthesia Procedure Notes (Signed)
Procedure Name: Intubation Performed by: Rolla Plate, CRNA Pre-anesthesia Checklist: Patient identified, Patient being monitored, Timeout performed, Emergency Drugs available and Suction available Patient Re-evaluated:Patient Re-evaluated prior to induction Oxygen Delivery Method: Circle system utilized Preoxygenation: Pre-oxygenation with 100% oxygen Induction Type: IV induction Ventilation: Mask ventilation without difficulty Laryngoscope Size: Miller and 2 Grade View: Grade I Tube type: Oral Tube size: 7.5 mm Number of attempts: 1 Placement Confirmation: ETT inserted through vocal cords under direct vision,  positive ETCO2 and breath sounds checked- equal and bilateral Secured at: 22 cm Tube secured with: Tape Dental Injury: Teeth and Oropharynx as per pre-operative assessment

## 2018-12-29 NOTE — Progress Notes (Signed)
Patients daughter Barnet Glasgow FMLA paper work has been completed and faxed over placed into completed folder and scan pile.

## 2018-12-29 NOTE — Anesthesia Preprocedure Evaluation (Signed)
Anesthesia Evaluation  Patient identified by MRN, date of birth, ID band Patient awake    Reviewed: Allergy & Precautions, NPO status , Patient's Chart, lab work & pertinent test results  History of Anesthesia Complications Negative for: history of anesthetic complications  Airway Mallampati: I  TM Distance: >3 FB Neck ROM: Full    Dental no notable dental hx.    Pulmonary neg pulmonary ROS, neg sleep apnea, neg COPD,    breath sounds clear to auscultation- rhonchi (-) wheezing      Cardiovascular Exercise Tolerance: Good (-) hypertension(-) CAD and (-) Past MI  Rhythm:Regular Rate:Normal - Systolic murmurs and - Diastolic murmurs    Neuro/Psych negative neurological ROS  negative psych ROS   GI/Hepatic Neg liver ROS, Colon cancer   Endo/Other  negative endocrine ROSneg diabetes  Renal/GU negative Renal ROS     Musculoskeletal negative musculoskeletal ROS (+)   Abdominal (+) - obese,   Peds  Hematology negative hematology ROS (+)   Anesthesia Other Findings Past Medical History: 04/15/2013: Benign localized hyperplasia of prostate with urinary  obstruction and other lower urinary tract symptoms (LUTS) No date: Cancer ([place])     Comment:  skin cancer 04/15/2013: Enlarged prostate with lower urinary tract symptoms (LUTS) 04/15/2013: Family history of prostate cancer 07/20/2016: History of degenerative disc disease     Comment:  Overview:  Status post C-spine surgery x 2, last in 1996 04/15/2013: Incomplete emptying of bladder 04/15/2013: Post-void dribbling 09/08/2014: Pure hypercholesterolemia 04/15/2013: Retention of urine   Reproductive/Obstetrics                             Anesthesia Physical Anesthesia Plan  ASA: II  Anesthesia Plan: General   Post-op Pain Management:    Induction: Intravenous  PONV Risk Score and Plan: 1 and Ondansetron and Midazolam  Airway Management Planned:  Oral ETT  Additional Equipment:   Intra-op Plan:   Post-operative Plan: Extubation in OR  Informed Consent: I have reviewed the patients History and Physical, chart, labs and discussed the procedure including the risks, benefits and alternatives for the proposed anesthesia with the patient or authorized representative who has indicated his/her understanding and acceptance.     Dental advisory given  Plan Discussed with: CRNA and Anesthesiologist  Anesthesia Plan Comments:         Anesthesia Quick Evaluation

## 2018-12-29 NOTE — Progress Notes (Signed)
Patient complains of pressure in bladder, voiced concern about "not being able to pee"; requested to be bladder scanned; reassured patient; encouraged increase in po intake; 86 ml noted; pain medication administered. Barbaraann Faster, RN 10:36 PM 12/29/2018

## 2018-12-29 NOTE — Anesthesia Post-op Follow-up Note (Signed)
Anesthesia QCDR form completed.        

## 2018-12-29 NOTE — Transfer of Care (Signed)
Immediate Anesthesia Transfer of Care Note  Patient: Darin Bell  Procedure(s) Performed: LAPAROSCOPIC SIGMOID COLECTOMY (N/A )  Patient Location: PACU  Anesthesia Type:General  Level of Consciousness: sedated  Airway & Oxygen Therapy: Patient Spontanous Breathing and Patient connected to face mask oxygen  Post-op Assessment: Report given to RN and Post -op Vital signs reviewed and stable  Post vital signs: Reviewed  Last Vitals:  Vitals Value Taken Time  BP 108/63 12/29/2018  5:16 PM  Temp 36.8 C 12/29/2018  5:15 PM  Pulse 63 12/29/2018  5:17 PM  Resp 13 12/29/2018  5:17 PM  SpO2 98 % 12/29/2018  5:17 PM  Vitals shown include unvalidated device data.  Last Pain:  Vitals:   12/29/18 1000  TempSrc: Temporal  PainSc: 0-No pain         Complications: No apparent anesthesia complications

## 2018-12-29 NOTE — H&P (Signed)
No change in clinical history or exam. For sigmoid colectomy. Tolerated prep well.

## 2018-12-29 NOTE — Op Note (Signed)
Preoperative diagnosis: Carcinoma sigmoid colon.  Postoperative diagnosis: Same.  Operative procedure: Laparoscopically assisted sigmoid colectomy, colocolostomy.  Operating Surgeon: Hervey Ard, MD.  Assistant: Arvilla Meres, RNFA.  Anesthesia: General endotracheal.  Estimated blood loss 100 cc.  Urine output: 100 cc.  Fluids: 1400 cc crystalloid.  Clinical note: This 73 year old male noted a change in bowel habits about 4 months ago.  Recent colonoscopy showed a apple core lesion in the sigmoid colon.  CT colonoscopy failed to show any proximal lesions.  The patient is admitted at this time for planned sigmoid resection.  Liver function and CEA are normal.  Noncontrast CT did not show evidence of liver disease.  The patient underwent formal bowel prep with oral antibiotics.  He received Invanz prior to the procedure.  SCD stockings for DVT prevention.  Operative note: The patient underwent general endotracheal anesthesia without difficulty.  A Foley catheter was passed with some resistance through the prostate but eventual easy passage into the bladder.  Clear urine was noted.  This was removed at the end of the procedure.  The perineum was prepped with Betadine solution and the abdomen prepped with ChloraPrep.  With the patient in Trendelenburg position a varies needle was placed with trans-umbilical incision.  After assuring intra-abdominal location with a hanging drop test the abdomen was insufflated with CO2 of 10 mmHg pressure.  A 10 mm Port was expanded and inspection showed no evidence of injury from initial port placement.  There was no evidence of intraperitoneal disease.  The visualized portions of the liver were unremarkable.  A 5 mm hypogastric port and an 11 mm epigastric port were then placed under direct vision.  The LigaSure device was used to free the left colon.  The left ureter was clearly visualized.  The splenic flexure was taken down with good hemostasis.  There was  redundant loop in this area and this was freed to allow easier mobilization of the left colon.  The area of the tumor appeared to be more in the distal sigmoid than middle sigmoid.  The abdomen was desufflated and an 8 cm incision below the umbilicus was made carried down to skin subtendinous tissue with hemostasis achieved by electrocautery.  The midline fascia was opened with cutting current and the peritoneum divided with cautery.  A medium Alexis wound protector was placed.  The tumor was palpated in the mid-distal sigmoid.  Using the harmonic scalpel the peritoneum was incised on either side and the mesenteric vessels were taken down with harmonic scalpel and with the left colic artery divided between 2-0 Vicryl ties.  The proximal colon was divided about 10 cm above the tumor with a GIA stapler.  The colon was then mobilized off the retroperitoneum.  A distal margin of about 4 cm was obtained by palpation of this area was cleared.  The mesentery was divided completely and the distal bowel controlled with a Glassman clamp.  Coker clamp placed to the proximal bowel and was then sharply divided.  The proximal colon easily reached the upper rectum.  It was elected to complete a handsewn anastomosis.  3-0 silk seromuscular sutures were placed on both corners and then posteriorly.  A full-thickness running locking suture of 3-0 Vicryl was used for the posterior wall and then changed to a [place] which are on the anterior wall.  A second layer of 3-0 silks were placed anteriorly.  The mesenteric defect was closed with a running 3-0 Vicryl suture.  A Glassman clamp was placed proximally and  a sigmoidoscope inserted.  This was insufflated with the pelvis filled with water.  No leak noted.  The sigmoidoscope was removed.  Surgeon's gloves were changed.  Attention was turned towards final inspection of the abdomen.  Palpation of the liver was unremarkable.  Gowns and gloves and instruments were changed and new drapes  applied.  The peritoneum was closed with a running 0 Vicryl suture.  The fascia was closed with interrupted 0 Maxon figure-of-eight sutures.  Adipose layer was closed with a running 2-0 Vicryl suture.  Skin incisions were closed with a running 4-0 Vicryl subcuticular suture.  Port sites were closed with interrupted 4-0 Vicryl subcuticular sutures.  A honeycomb dressing was applied to the surgical site and Telfa and Tegaderm dressings applied to the port sites.  The patient tolerated the procedure well and was taken to the recovery room in stable condition.

## 2018-12-30 ENCOUNTER — Encounter: Payer: Self-pay | Admitting: General Surgery

## 2018-12-30 LAB — ABO/RH: ABO/RH(D): A NEG

## 2018-12-30 MED ORDER — ACETAMINOPHEN 325 MG PO TABS
650.0000 mg | ORAL_TABLET | ORAL | Status: DC
  Administered 2018-12-30 – 2018-12-31 (×7): 650 mg via ORAL
  Filled 2018-12-30 (×7): qty 2

## 2018-12-30 NOTE — Progress Notes (Signed)
AVSS. Ambulated to RR last PM.  Required cath early this AM,has voided since then. Minimal pain. No flatus. Nausea only when first getting up. Lungs: Clear. Cardio: RR. ABD: Soft, non-tender. Dressings: Dry. Extrem: Soft. Reviewed home Lovenox RX. Will advance diet.

## 2018-12-30 NOTE — Anesthesia Postprocedure Evaluation (Signed)
Anesthesia Post Note  Patient: Alease Medina Foisy  Procedure(s) Performed: LAPAROSCOPIC SIGMOID COLECTOMY (N/A )  Patient location during evaluation: PACU Anesthesia Type: General Level of consciousness: awake and alert Pain management: pain level controlled Vital Signs Assessment: post-procedure vital signs reviewed and stable Respiratory status: spontaneous breathing, nonlabored ventilation, respiratory function stable and patient connected to nasal cannula oxygen Cardiovascular status: blood pressure returned to baseline and stable Postop Assessment: no apparent nausea or vomiting Anesthetic complications: no     Last Vitals:  Vitals:   12/30/18 0014 12/30/18 0453  BP: 124/65 139/79  Pulse: 81 75  Resp: 16 18  Temp: 36.9 C 37.1 C  SpO2: 98% 96%    Last Pain:  Vitals:   12/30/18 0453  TempSrc: Oral  PainSc:                  Precious Haws Bobbijo Holst

## 2018-12-30 NOTE — Progress Notes (Signed)
Patient complaining of bladder pain and inability to void; bladder scanned with 584 ml resulted; attempted to sit on side of bed to void; water ran, without voiding; Dr. Hampton Abbot paged; awaiting callback. Barbaraann Faster, RN 3:55 AM 12/30/2018

## 2018-12-30 NOTE — Progress Notes (Signed)
I/O with coude catheter tolerated well with 500 ml clear, amber urine; patient voiced disappointment in not getting foley to stay;  Encouraged increased po fluid intake; voiced understanding. Barbaraann Faster, RN 4:44 AM 12/30/2018

## 2018-12-30 NOTE — Progress Notes (Signed)
Dr. Hampton Abbot notified of inability to void and bladder scan results; daughter also stated that she/patient forgot to tell Dr. Bary Castilla that he always needs a catheter postop for inability to void r/t prostate/TURP issues and retention; acknowledged; new order to I/O once. Barbaraann Faster, RN 4:02 AM 12/30/2018

## 2018-12-31 MED ORDER — IBUPROFEN 200 MG PO TABS: 200.0000 mg | ORAL_TABLET | ORAL | 0 refills | Status: AC | PRN

## 2018-12-31 MED ORDER — ENOXAPARIN SODIUM 40 MG/0.4ML ~~LOC~~ SOLN: 40.0000 mg | SUBCUTANEOUS | 0 refills | Status: DC

## 2018-12-31 NOTE — Care Management (Signed)
Per MD request.  Lovenox 40 Mg SQ daily for 14 days called in to patient's pharmacy [facility].  Copay is $90.  This information was relayed to patient's daughter.  She states the will be able to obtain the medication at discharge.

## 2018-12-31 NOTE — Progress Notes (Signed)
Pt discharged per MD order. IVs removed. Discharge instructions reviewed with pt and his daughter. Pt verbalized understanding and all questions answered to pt and daughter satisfaction. Pt taken downstairs in wheelchair by staff.

## 2018-12-31 NOTE — Discharge Instructions (Signed)
Tylenol/ Advil/ Aleve: If needed for soreness.  OK to shower.  No lifting over 10 pounds.  No driving until pain free.  Use you incentive spirometer frequently thru the day.  Walk.  Lovenox injection: Daily.

## 2018-12-31 NOTE — Progress Notes (Signed)
AVSS. Tolerated full liquids without nausea. Ambulating well. Voiding OK. Passing flatus, BM this AM. Lungs: Clear. Cardio: RR. ABD: Mild distension, soft, non-tender.  Wounds: Clean and dry. Extrem: Soft. Plan: Advance diet, possible D/C this afternoon.

## 2019-01-01 ENCOUNTER — Telehealth: Payer: Self-pay | Admitting: General Surgery

## 2019-01-01 NOTE — Telephone Encounter (Signed)
I spoke with the patient's wife who reports he doing well.  He is tolerating his Lovenox shots being administered by his daughter.  There were notified that the pathology showed all of the lymph glands clear..  We will plan for follow-up next week is presently as previously scheduled.

## 2019-01-02 ENCOUNTER — Other Ambulatory Visit: Payer: Self-pay | Admitting: General Surgery

## 2019-01-05 LAB — SURGICAL PATHOLOGY

## 2019-01-08 ENCOUNTER — Encounter: Payer: Self-pay | Admitting: General Surgery

## 2019-01-08 ENCOUNTER — Telehealth: Payer: Self-pay | Admitting: *Deleted

## 2019-01-08 ENCOUNTER — Other Ambulatory Visit: Payer: Self-pay

## 2019-01-08 ENCOUNTER — Other Ambulatory Visit: Payer: Self-pay | Admitting: *Deleted

## 2019-01-08 ENCOUNTER — Ambulatory Visit (INDEPENDENT_AMBULATORY_CARE_PROVIDER_SITE_OTHER): Payer: PPO | Admitting: General Surgery

## 2019-01-08 VITALS — HR 82 | Temp 97.7°F | Ht 71.0 in | Wt 178.2 lb

## 2019-01-08 DIAGNOSIS — C187 Malignant neoplasm of sigmoid colon: Secondary | ICD-10-CM

## 2019-01-08 NOTE — Progress Notes (Signed)
Patient ID: Darin Bell, male   DOB: 09-Mar-1946, 73 y.o.   MRN: [id number]  Chief Complaint  Patient presents with  . Routine Post Op    office visit.  1 week post op colectomy sx 12/29/18    HPI Darin Bell is a 73 y.o. male here today to follow up for post op colectomy sx 12/29/2018. Patient states he is doing well and feeling really good.  The patient has been making use of Lovenox 40 mg daily as requested.  He will complete his therapy in 1 week.  (14-day total).  He reports regular bowel movements without difficulty.  No urinary difficulties.  Diet has been well-tolerated.  He did develop a small blister near the epigastric port site from the Steri-Strip which is healing nicely. HPI  Past Medical History:  Diagnosis Date  . Benign localized hyperplasia of prostate with urinary obstruction and other lower urinary tract symptoms (LUTS) 04/15/2013  . Cancer ([place])    skin cancer  . Cancer of sigmoid colon ([place]) 12/29/2018   T3, N0, lymphovascular invasion.  Margins clear. No loss of MMR proteins.   . Enlarged prostate with lower urinary tract symptoms (LUTS) 04/15/2013  . Family history of prostate cancer 04/15/2013  . History of degenerative disc disease 07/20/2016   Overview:  Status post C-spine surgery x 2, last in 1996  . Incomplete emptying of bladder 04/15/2013  . Post-void dribbling 04/15/2013  . Pure hypercholesterolemia 09/08/2014  . Retention of urine 04/15/2013    Past Surgical History:  Procedure Laterality Date  . BACK SURGERY  1994   cervical disc  . COLONOSCOPY WITH PROPOFOL N/A 12/16/2018   Procedure: COLONOSCOPY WITH PROPOFOL;  Surgeon: Lollie Sails, MD;  Location: [facility] ENDOSCOPY;  Service: Endoscopy;  Laterality: N/A;  . EYE SURGERY Bilateral    Cataract Extraction with IOL  . HERNIA REPAIR Right 2015   Inguinal Hernia Repair  . LAPAROSCOPIC SIGMOID COLECTOMY N/A 12/29/2018   Procedure: LAPAROSCOPIC SIGMOID COLECTOMY;  Surgeon: Robert Bellow, MD;  Location:  [facility] ORS;  Service: General;  Laterality: N/A;  . TRANSURETHRAL RESECTION OF PROSTATE N/A 08/13/2016   Procedure: TRANSURETHRAL RESECTION OF THE PROSTATE (TURP);  Surgeon: Hollice Espy, MD;  Location: [facility] ORS;  Service: Urology;  Laterality: N/A;    Family History  Problem Relation Age of Onset  . Prostate cancer Father   . Cancer Father   . Diabetes Father   . Kidney cancer Neg Hx     Social History Social History   Tobacco Use  . Smoking status: Never Smoker  . Smokeless tobacco: Never Used  Substance Use Topics  . Alcohol use: Not on file  . Drug use: No    Allergies  Allergen Reactions  . Oxycodone-Acetaminophen     Constipation, Retention per patient  . Ciprofloxacin Diarrhea  . Alfuzosin     Dizziness  . Bactrim [Sulfamethoxazole-Trimethoprim] Other (See Comments)    GI distress  . Dutasteride   . Tamsulosin     Dizziness    Current Outpatient Medications  Medication Sig Dispense Refill  . enoxaparin (LOVENOX) 40 MG/0.4ML injection Inject 0.4 mLs (40 mg total) into the skin daily. 14 Syringe 0  . ibuprofen (ADVIL,MOTRIN) 200 MG tablet Take 1 tablet (200 mg total) by mouth every 4 (four) hours as needed for mild pain. 30 tablet 0  . prednisoLONE acetate (PRED FORTE) 1 % ophthalmic suspension Place 1 drop into the left eye 2 (two) times daily.  No current facility-administered medications for this visit.     Review of Systems Review of Systems  Constitutional: Negative.   Respiratory: Negative.   Cardiovascular: Negative.     Pulse 82, temperature 97.7 F (36.5 C), temperature source Temporal, height _0  (1.803 m), weight 178 lb 3.2 oz (80.8 kg), SpO2 96 %.  Physical Exam Physical Exam Constitutional:      Appearance: He is well-developed.  Eyes:     General: No scleral icterus.    Conjunctiva/sclera: Conjunctivae normal.  Neck:     Musculoskeletal: Normal range of motion.  Cardiovascular:     Rate and Rhythm: Normal rate and regular  rhythm.     Heart sounds: Normal heart sounds.  Pulmonary:     Effort: Pulmonary effort is normal.     Breath sounds: Normal breath sounds.  Abdominal:     General: Abdomen is flat.     Palpations: Abdomen is soft.    Lymphadenopathy:     Cervical: No cervical adenopathy.  Skin:    General: Skin is warm and dry.  Neurological:     Mental Status: He is alert and oriented to person, place, and time.     Data Reviewed A. COLON, SIGMOID; COLECTOMY:  - INVASIVE MODERATELY DIFFERENTIATED ADENOCARCINOMA.  - TUMOR IS INVASIVE FOCALLY INTO PERICOLONIC ADIPOSE TISSUE.  - 16 LYMPH NODES NEGATIVE FOR METASTATIC CARCINOMA.  - SURGICAL MARGINS NEGATIVE FOR TUMOR.  Immunohistochemistry (IHC) Testing for DNA Mismatch Repair (MMR)  Proteins:  Results:  MLH1: Intact nuclear expression  MSH2: Intact nuclear expression  MSH6: Intact nuclear expression  PMS2: Intact nuclear expression  IHC Interpretation: No loss of nuclear expression of MMR proteins: Low  probability of MSI-H.   Preoperative CEA: 2.8.  Assessment Doing well status post laparoscopically assisted sigmoid colectomy.  Plan We will obtain a repeat CEA at the [facility] lab today.  He will be notified of results of today's [facility] tumor board recommendations.  Follow-up in 1 month.   HPI, Physical Exam, Assessment and Plan have been scribed under the direction and in the presence of Hervey Ard, Md.  Eudelia Bunch R. Bobette Mo, CMA Forest Gleason Raveen Wieseler 01/08/2019, 10:21 AM

## 2019-01-08 NOTE — Telephone Encounter (Signed)
-----   Message from Robert Bellow, MD sent at 01/08/2019  3:35 PM EDT ----- Need to have Oncotype DX on the colon sample run. Patient needs to have a non-contrast chest CT and a CT of the abdomen and pelvis with contrast. DX: Stage 2 colon cancer.

## 2019-01-08 NOTE — Progress Notes (Signed)
Tumor Board Documentation  Darin Bell was presented by Dr Bary Castilla at our Tumor Board on 01/08/2019, which included representatives from surgical, medical oncology, radiation oncology, surgical oncology, radiology, pathology, navigation, internal medicine, pharmacy, genetics, research, palliative care, pulmonology.  Darin Bell currently presents for new positive pathology, for [place], for discussion with history of the following treatments: surgical intervention(s).  Additionally, we reviewed previous medical and familial history, history of present illness, and recent lab results along with all available histopathologic and imaging studies. The tumor board considered available treatment options and made the following recommendations: Additional screening Baseline CT Chest adn contrasted Liver CT, Oncotype Dx Testing on tumor  The following procedures/referrals were also placed: No orders of the defined types were placed in this encounter.   Clinical Trial Status: not discussed   Staging used: AJCC Stage Group  AJCC Staging: T: pT3 N: pN0   Group: Stage 2 Colon cancer  National site-specific guidelines NCCN were discussed with respect to the case.  Tumor board is a meeting of clinicians from various specialty areas who evaluate and discuss patients for whom a multidisciplinary approach is being considered. Final determinations in the plan of care are those of the provider(s). The responsibility for follow up of recommendations given during tumor board is that of the provider.   Today's extended care, comprehensive team conference, Darin Bell was not present for the discussion and was not examined.   Multidisciplinary Tumor Board is a multidisciplinary case peer review process.  Decisions discussed in the Multidisciplinary Tumor Board reflect the opinions of the specialists present at the conference without having examined the patient.  Ultimately, treatment and diagnostic decisions rest with the  primary provider(s) and the patient.

## 2019-01-08 NOTE — Telephone Encounter (Signed)
Order form and information sent to [facility] for Oncotype

## 2019-01-08 NOTE — Discharge Summary (Signed)
Physician Discharge Summary  Patient ID: Darin Bell MRN: [id number] DOB/AGE: 72-Sep-1947 72 y.o.  Admit date: 12/29/2018 Discharge date: 01/08/2019  Admission Diagnoses: Colon cancer  Discharge Diagnoses:  Active Problems:   Colon cancer [facility])   Discharged Condition: good  Hospital Course: The patient underwent a low sigmoid resection with primary anastomosis.  He was started on clear liquids the day of surgery and advance to a soft diet which he tolerated well.  Spontaneous bowel movement on postoperative day 1.  Good analgesic effect with oral medications.  Consults: None  Significant Diagnostic Studies: Pathology showed a T3, N0 tumor, lymphovascular invasion.  Margins clear.  0/16 nodes.  Treatments: IV hydration  Discharge Exam: Blood pressure (!) 144/87, pulse 79, temperature 98.6 F (37 C), temperature source Oral, resp. rate 16, height 5\' 11"  (1.803 m), weight 83 kg, SpO2 97 %. General appearance: alert and cooperative Resp: clear to auscultation bilaterally Cardio: regular rate and rhythm, S1, S2 normal, no murmur, click, rub or gallop GI: soft, non-tender; bowel sounds normal; no masses,  no organomegaly Incision/Wound: Incisions healing well. The patient will be discharged on Lovenox 0.4 mg subcu daily for a total of 2 weeks to minimize the risk of postoperative DVT.  Disposition:   Discharge Instructions    Diet - low sodium heart healthy   Complete by:  As directed    Increase activity slowly   Complete by:  As directed      Allergies as of 12/31/2018      Reactions   Oxycodone-acetaminophen    Constipation, Retention per patient   Ciprofloxacin Diarrhea   Alfuzosin    Dizziness   Bactrim [sulfamethoxazole-trimethoprim] Other (See Comments)   GI distress   Dutasteride    Tamsulosin    Dizziness      Medication List    STOP taking these medications   metroNIDAZOLE 500 MG tablet Commonly known as:  FLAGYL   neomycin 500 MG tablet Commonly known  as:  MYCIFRADIN   polyethylene glycol powder powder Commonly known as:  GLYCOLAX/MIRALAX     TAKE these medications   enoxaparin 40 MG/0.4ML injection Commonly known as:  LOVENOX Inject 0.4 mLs (40 mg total) into the skin daily.   ibuprofen 200 MG tablet Commonly known as:  ADVIL,MOTRIN Take 1 tablet (200 mg total) by mouth every 4 (four) hours as needed for mild pain.   prednisoLONE acetate 1 % ophthalmic suspension Commonly known as:  PRED FORTE Place 1 drop into the left eye 2 (two) times daily.      Follow-up Information    Desa Rech, Forest Gleason, MD Follow up in 1 week(s).   Specialties:  General Surgery, Radiology Why:  Please call the office Thursday AM [phone]) to arrange a follow up appointment for next week.  Contact information: [address] [place] [address] [phone]           Signed: Robert Bellow 01/08/2019, 3:48 PM

## 2019-01-08 NOTE — Telephone Encounter (Signed)
Patient has been scheduled for a CT abdomen/pelvis with contrast and CT chest without contrast at [place] for 01-13-19 at 8 am (arrive 7:45 am). Prep: NPO 4 hours prior and pick up prep kit. Patient verbalizes understanding.  The patient states he did not go for lab work today but is planning to go to [facility] to have done within the next few days. (Patient was provided with a signed order for labs today by Red which he will take to [facility].)

## 2019-01-08 NOTE — Patient Instructions (Addendum)
Patient will need to return to the office 1 month , he will need lab work completed (CEA).      Call the office with any questions or concerns.

## 2019-01-09 DIAGNOSIS — C187 Malignant neoplasm of sigmoid colon: Secondary | ICD-10-CM | POA: Diagnosis not present

## 2019-01-13 ENCOUNTER — Ambulatory Visit: Admission: RE | Admit: 2019-01-13 | Payer: PPO | Source: Ambulatory Visit

## 2019-01-13 ENCOUNTER — Telehealth: Payer: Self-pay

## 2019-01-13 ENCOUNTER — Other Ambulatory Visit: Payer: Self-pay

## 2019-01-13 ENCOUNTER — Ambulatory Visit
Admission: RE | Admit: 2019-01-13 | Discharge: 2019-01-13 | Disposition: A | Discharge status: Home / Self Care | Payer: PPO | Source: Ambulatory Visit | Attending: General Surgery | Admitting: General Surgery

## 2019-01-13 ENCOUNTER — Telehealth: Payer: Self-pay | Admitting: General Surgery

## 2019-01-13 DIAGNOSIS — C187 Malignant neoplasm of sigmoid colon: Secondary | ICD-10-CM | POA: Insufficient documentation

## 2019-01-13 DIAGNOSIS — K449 Diaphragmatic hernia without obstruction or gangrene: Secondary | ICD-10-CM | POA: Diagnosis not present

## 2019-01-13 DIAGNOSIS — C801 Malignant (primary) neoplasm, unspecified: Secondary | ICD-10-CM | POA: Diagnosis not present

## 2019-01-13 MED ORDER — IOHEXOL 300 MG/ML  SOLN
100.0000 mL | Freq: Once | INTRAMUSCULAR | Status: AC | PRN
  Administered 2019-01-13: 100 mL via INTRAVENOUS

## 2019-01-13 NOTE — Telephone Encounter (Signed)
The patient was notified that his CT and laboratory studies are all within good order.  Preoperative CEA at [place] was 2.8, postoperative 2.0.  Mild change, minimal benefit from biannual screening.  (T3 lesion).  Preoperative laboratory including a CBC and comprehensive metabolic panel was notable for a nonfasting blood sugar of 102, bilirubin of 1.3 (previous total bilirubin up to 1.7 in November 2015, likely Gilbert's syndrome with otherwise normal liver function studies and a normal CBC.  He reports doing well.  We will plan for follow-up of the end of April as presently scheduled.  The patient has a scheduled physical with his primary care physician the same day.  The patient was advised he will likely need to have his lipid studies checked as part of his physical examination.

## 2019-01-13 NOTE — Telephone Encounter (Signed)
-----   Message from Robert Bellow, MD sent at 01/13/2019  9:32 AM EDT ----- Please notify the patient the CT is fine.  See if he had the CEA drawn at [facility] as he had originally planned after his last OV here. Thanks. If drawn, see if we can get results. (Not in care everywhere this AM.) ----- Message ----- From: Interface, Rad Results In Sent: 01/13/2019   9:08 AM EDT To: Robert Bellow, MD

## 2019-01-13 NOTE — Telephone Encounter (Signed)
Notified patient as instructed, patient pleased. Discussed follow-up appointments, patient agrees  Patient states that he had his blood work done at [facility] on Friday, 01/09/19.

## 2019-01-14 DIAGNOSIS — Z9181 History of falling: Secondary | ICD-10-CM | POA: Diagnosis not present

## 2019-01-14 DIAGNOSIS — N184 Chronic kidney disease, stage 4 (severe): Secondary | ICD-10-CM | POA: Diagnosis not present

## 2019-01-14 DIAGNOSIS — E1140 Type 2 diabetes mellitus with diabetic neuropathy, unspecified: Secondary | ICD-10-CM | POA: Diagnosis not present

## 2019-01-14 DIAGNOSIS — T24232D Burn of second degree of left lower leg, subsequent encounter: Secondary | ICD-10-CM | POA: Diagnosis not present

## 2019-01-14 DIAGNOSIS — Z947 Corneal transplant status: Secondary | ICD-10-CM | POA: Diagnosis not present

## 2019-01-14 DIAGNOSIS — H1851 Endothelial corneal dystrophy: Secondary | ICD-10-CM | POA: Diagnosis not present

## 2019-01-14 DIAGNOSIS — Z7902 Long term (current) use of antithrombotics/antiplatelets: Secondary | ICD-10-CM | POA: Diagnosis not present

## 2019-01-14 DIAGNOSIS — D631 Anemia in chronic kidney disease: Secondary | ICD-10-CM | POA: Diagnosis not present

## 2019-01-14 DIAGNOSIS — I2510 Atherosclerotic heart disease of native coronary artery without angina pectoris: Secondary | ICD-10-CM | POA: Diagnosis not present

## 2019-01-14 DIAGNOSIS — I130 Hypertensive heart and chronic kidney disease with heart failure and stage 1 through stage 4 chronic kidney disease, or unspecified chronic kidney disease: Secondary | ICD-10-CM | POA: Diagnosis not present

## 2019-01-14 DIAGNOSIS — I509 Heart failure, unspecified: Secondary | ICD-10-CM | POA: Diagnosis not present

## 2019-01-14 DIAGNOSIS — Z7982 Long term (current) use of aspirin: Secondary | ICD-10-CM | POA: Diagnosis not present

## 2019-01-14 DIAGNOSIS — Z794 Long term (current) use of insulin: Secondary | ICD-10-CM | POA: Diagnosis not present

## 2019-01-14 DIAGNOSIS — S72141D Displaced intertrochanteric fracture of right femur, subsequent encounter for closed fracture with routine healing: Secondary | ICD-10-CM | POA: Diagnosis not present

## 2019-01-14 DIAGNOSIS — E1122 Type 2 diabetes mellitus with diabetic chronic kidney disease: Secondary | ICD-10-CM | POA: Diagnosis not present

## 2019-01-14 DIAGNOSIS — Z951 Presence of aortocoronary bypass graft: Secondary | ICD-10-CM | POA: Diagnosis not present

## 2019-01-14 DIAGNOSIS — E1151 Type 2 diabetes mellitus with diabetic peripheral angiopathy without gangrene: Secondary | ICD-10-CM | POA: Diagnosis not present

## 2019-01-19 DIAGNOSIS — C187 Malignant neoplasm of sigmoid colon: Secondary | ICD-10-CM | POA: Diagnosis not present

## 2019-01-20 ENCOUNTER — Telehealth: Payer: Self-pay | Admitting: General Surgery

## 2019-01-20 ENCOUNTER — Encounter: Payer: Self-pay | Admitting: General Surgery

## 2019-01-20 NOTE — Telephone Encounter (Signed)
The patient was contacted in regards to his Oncotype DX test results received earlier today.  This showed a benefit from adjuvant chemotherapy.  The patient reports feeling well.  Energy level is good.  No difficulty with diet or bowel function.  He is amenable to meet with medical oncology and this will be scheduled at a convenient date in the near future.

## 2019-01-21 ENCOUNTER — Other Ambulatory Visit: Payer: Self-pay

## 2019-01-21 ENCOUNTER — Encounter: Payer: Self-pay | Admitting: General Surgery

## 2019-01-21 DIAGNOSIS — C187 Malignant neoplasm of sigmoid colon: Secondary | ICD-10-CM

## 2019-01-22 ENCOUNTER — Other Ambulatory Visit: Payer: Self-pay

## 2019-01-22 ENCOUNTER — Inpatient Hospital Stay: Payer: PPO | Attending: Internal Medicine | Admitting: Internal Medicine

## 2019-01-22 ENCOUNTER — Encounter: Payer: Self-pay | Admitting: Internal Medicine

## 2019-01-22 VITALS — BP 135/88 | HR 75 | Temp 96.7°F | Resp 16 | Wt 182.8 lb

## 2019-01-22 DIAGNOSIS — Z9049 Acquired absence of other specified parts of digestive tract: Secondary | ICD-10-CM | POA: Diagnosis not present

## 2019-01-22 DIAGNOSIS — Z85828 Personal history of other malignant neoplasm of skin: Secondary | ICD-10-CM

## 2019-01-22 DIAGNOSIS — K760 Fatty (change of) liver, not elsewhere classified: Secondary | ICD-10-CM

## 2019-01-22 DIAGNOSIS — C187 Malignant neoplasm of sigmoid colon: Secondary | ICD-10-CM

## 2019-01-22 NOTE — Progress Notes (Signed)
[place] NOTE  Patient Care Team: Adin Hector, MD as PCP - General (Internal Medicine)  CHIEF COMPLAINTS/PURPOSE OF CONSULTATION:  Colon cancer  #  Oncology History   # MARCH 2020-left/sigmoid COLON CANCER stage II pT3N0; Oncotype- RS-11 [risk of recurrence- 12%; Dr.Byrnett]; no adjuvant chemotherapy.  CT chest and pelvis negative for metastatic disease  # DIAGNOSIS: Colon cancer left side  STAGE: 2        ;GOALS: Cure  CURRENT/MOST RECENT THERAPY : Surveillance      Malignant neoplasm of sigmoid colon ([place])   12/24/2018 Initial Diagnosis    Malignant neoplasm of sigmoid colon ([facility])      HISTORY OF PRESENTING ILLNESS:  Darin Bell 73 y.o.  male with no signal past medical history had episodes of intermittent constipation for which he had a colonoscopy that showed malignancy.  This led to left hemicolectomy.  Patient is here to review the pathology/possible need for adjuvant therapy.  Denies any tingling or numbness but denies any nausea vomiting but appetite is good.  No weight loss.  No postoperative complications.  Review of Systems  Constitutional: Negative for chills, diaphoresis, fever, malaise/fatigue and weight loss.  HENT: Negative for nosebleeds and sore throat.   Eyes: Negative for double vision.  Respiratory: Negative for cough, hemoptysis, sputum production, shortness of breath and wheezing.   Cardiovascular: Negative for chest pain, palpitations, orthopnea and leg swelling.  Gastrointestinal: Negative for abdominal pain, blood in stool, constipation, diarrhea, heartburn, melena, nausea and vomiting.  Genitourinary: Negative for dysuria, frequency and urgency.  Musculoskeletal: Negative for back pain and joint pain.  Skin: Negative.  Negative for itching and rash.  Neurological: Negative for dizziness, tingling, focal weakness, weakness and headaches.  Endo/Heme/Allergies: Does not bruise/bleed easily.  Psychiatric/Behavioral:  Negative for depression. The patient is not nervous/anxious and does not have insomnia.      MEDICAL HISTORY:  Past Medical History:  Diagnosis Date  . Benign localized hyperplasia of prostate with urinary obstruction and other lower urinary tract symptoms (LUTS) 04/15/2013  . Cancer ([place])    skin cancer  . Cancer of sigmoid colon ([place]) 12/29/2018   T3, N0, lymphovascular invasion.  Margins clear. No loss of MMR proteins.   . Enlarged prostate with lower urinary tract symptoms (LUTS) 04/15/2013  . Family history of prostate cancer 04/15/2013  . History of degenerative disc disease 07/20/2016   Overview:  Status post C-spine surgery x 2, last in 1996  . Incomplete emptying of bladder 04/15/2013  . Post-void dribbling 04/15/2013  . Pure hypercholesterolemia 09/08/2014  . Retention of urine 04/15/2013    SURGICAL HISTORY: Past Surgical History:  Procedure Laterality Date  . BACK SURGERY  1994   cervical disc  . COLONOSCOPY WITH PROPOFOL N/A 12/16/2018   Procedure: COLONOSCOPY WITH PROPOFOL;  Surgeon: Lollie Sails, MD;  Location: [facility] ENDOSCOPY;  Service: Endoscopy;  Laterality: N/A;  . EYE SURGERY Bilateral    Cataract Extraction with IOL  . HERNIA REPAIR Right 2015   Inguinal Hernia Repair  . LAPAROSCOPIC SIGMOID COLECTOMY N/A 12/29/2018   Procedure: LAPAROSCOPIC SIGMOID COLECTOMY;  Surgeon: Robert Bellow, MD;  Location: [facility] ORS;  Service: General;  Laterality: N/A;  . TRANSURETHRAL RESECTION OF PROSTATE N/A 08/13/2016   Procedure: TRANSURETHRAL RESECTION OF THE PROSTATE (TURP);  Surgeon: Hollice Espy, MD;  Location: [facility] ORS;  Service: Urology;  Laterality: N/A;    SOCIAL HISTORY: Social History   Socioeconomic History  . Marital status: Single  Spouse name: Not on file  . Number of children: 3  . Years of education: Not on file  . Highest education level: Not on file  Occupational History  . Not on file  Social Needs  . Financial resource strain: Not on file  . Food  insecurity:    Worry: Not on file    Inability: Not on file  . Transportation needs:    Medical: Not on file    Non-medical: Not on file  Tobacco Use  . Smoking status: Never Smoker  . Smokeless tobacco: Never Used  Substance and Sexual Activity  . Alcohol use: Not on file  . Drug use: No  . Sexual activity: Not on file  Lifestyle  . Physical activity:    Days per week: Not on file    Minutes per session: Not on file  . Stress: Not on file  Relationships  . Social connections:    Talks on phone: Not on file    Gets together: Not on file    Attends religious service: Not on file    Active member of club or organization: Not on file    Attends meetings of clubs or organizations: Not on file    Relationship status: Not on file  . Intimate partner violence:    Fear of current or ex partner: Not on file    Emotionally abused: Not on file    Physically abused: Not on file    Forced sexual activity: Not on file  Other Topics Concern  . 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 [name]; lives with wife.     FAMILY HISTORY: Family History  Problem Relation Age of Onset  . Prostate cancer Father   . Cancer Father   . Diabetes Father   . Kidney cancer Neg Hx     ALLERGIES:  is allergic to oxycodone-acetaminophen; ciprofloxacin; alfuzosin; bactrim [sulfamethoxazole-trimethoprim]; dutasteride; and tamsulosin.  MEDICATIONS:  Current Outpatient Medications  Medication Sig Dispense Refill  . ibuprofen (ADVIL,MOTRIN) 200 MG tablet Take 1 tablet (200 mg total) by mouth every 4 (four) hours as needed for mild pain. 30 tablet 0  . prednisoLONE acetate (PRED FORTE) 1 % ophthalmic suspension Place 1 drop into the left eye 2 (two) times daily.    Marland Kitchen enoxaparin (LOVENOX) 40 MG/0.4ML injection Inject 0.4 mLs (40 mg total) into the skin daily. 14 Syringe 0   No current facility-administered medications for this visit.       Marland Kitchen  PHYSICAL  EXAMINATION: ECOG PERFORMANCE STATUS: 0 - Asymptomatic  Vitals:   01/22/19 0924  BP: 135/88  Pulse: 75  Resp: 16  Temp: (!) 96.7 F (35.9 C)   Filed Weights   01/22/19 0924  Weight: 182 lb 12.8 oz (82.9 kg)    Physical Exam  Constitutional: He is oriented to person, place, and time and well-developed, well-nourished, and in no distress.  HENT:  Head: Normocephalic and atraumatic.  Mouth/Throat: Oropharynx is clear and moist. No oropharyngeal exudate.  Eyes: Pupils are equal, round, and reactive to light.  Neck: Normal range of motion. Neck supple.  Cardiovascular: Normal rate and regular rhythm.  Pulmonary/Chest: No respiratory distress. He has no wheezes.  Abdominal: Soft. Bowel sounds are normal. He exhibits no distension and no mass. There is no abdominal tenderness. There is no rebound and no guarding.  Musculoskeletal: Normal range of motion.        General: No tenderness or edema.  Neurological: He  is alert and oriented to person, place, and time.  Skin: Skin is warm.  Psychiatric: Affect normal.     LABORATORY DATA:  I have reviewed the data as listed Lab Results  Component Value Date   WBC 5.3 12/25/2018   HGB 13.7 12/25/2018   HCT 39.8 12/25/2018   MCV 93.2 12/25/2018   PLT 169 12/25/2018   Recent Labs    12/25/18 1326  NA 140  K 3.9  CL 107  CO2 26  GLUCOSE 102*  BUN 21  CREATININE 0.83  CALCIUM 8.9  GFRNONAA >60  GFRAA >60  PROT 6.8  ALBUMIN 3.9  AST 25  ALT 25  ALKPHOS 55  BILITOT 1.3*    RADIOGRAPHIC STUDIES: I have personally reviewed the radiological images as listed and agreed with the findings in the report. Ct Chest W Contrast  Result Date: 01/13/2019 CLINICAL DATA:  Status post sigmoid colectomy for 5 cm invasive adenocarcinoma. Negative surgical margins and 16 negative lymph nodes. EXAM: CT CHEST, ABDOMEN, AND PELVIS WITH CONTRAST TECHNIQUE: Multidetector CT imaging of the chest, abdomen and pelvis was performed following the  standard protocol during bolus administration of intravenous contrast. CONTRAST:  192m OMNIPAQUE IOHEXOL 300 MG/ML  SOLN COMPARISON:  None. FINDINGS: CT CHEST FINDINGS Cardiovascular: The heart size is normal. No pericardial fluid identified. Calcified coronary artery plaque present in a 3 vessel distribution. The thoracic aorta is normal in caliber. Central pulmonary arteries are also normal in caliber. Mediastinum/Nodes: No enlarged mediastinal, hilar, or axillary lymph nodes. Thyroid gland, trachea, and esophagus demonstrate no significant findings. Lungs/Pleura: There is no evidence of pulmonary edema, consolidation, pneumothorax, nodule or pleural fluid. Musculoskeletal: No chest wall mass or suspicious bone lesions identified. CT ABDOMEN PELVIS FINDINGS Hepatobiliary: The liver demonstrates steatosis without evidence of focal lesions, overt cirrhosis or biliary dilatation. The gallbladder is normal. Pancreas: Unremarkable. No pancreatic ductal dilatation or surrounding inflammatory changes. Spleen: Normal in size without focal abnormality. Adrenals/Urinary Tract: Adrenal glands are normal. The kidneys are unremarkable with 1.6 cm lower pole cyst of the left kidney present. No evidence of solid renal masses or calculi. No hydronephrosis. The bladder is decompressed and unremarkable in appearance. Stomach/Bowel: Bowel shows no evidence of obstruction, ileus, inflammation or visible lesion. No complications are evident after sigmoid colectomy with no evidence of free intraperitoneal air, abnormal fluid collection or colonic stricture. Vascular/Lymphatic: No significant vascular findings are present. No enlarged abdominal or pelvic lymph nodes. Reproductive: Prostate is unremarkable. Other: Tiny left inguinal hernia contains fat.  No free fluid. Musculoskeletal: Degenerative disc disease at the L4-5 level with disc space narrowing and vacuum disc present. No evidence of bony lesions or fractures. IMPRESSION: 1.  No evidence of metastatic carcinoma in the chest, abdomen or pelvis. 2. No evidence of complication following recent sigmoid colectomy. 3. Diffuse hepatic steatosis without evidence of cirrhosis or focal liver lesion. 4. Tiny left inguinal hernia containing fat. 5. Coronary atherosclerosis with calcified plaque in a 3 vessel distribution. Electronically Signed   By: GAletta EdouardM.D.   On: 01/13/2019 09:05   Ct Abdomen Pelvis W Contrast  Result Date: 01/13/2019 CLINICAL DATA:  Status post sigmoid colectomy for 5 cm invasive adenocarcinoma. Negative surgical margins and 16 negative lymph nodes. EXAM: CT CHEST, ABDOMEN, AND PELVIS WITH CONTRAST TECHNIQUE: Multidetector CT imaging of the chest, abdomen and pelvis was performed following the standard protocol during bolus administration of intravenous contrast. CONTRAST:  1094mOMNIPAQUE IOHEXOL 300 MG/ML  SOLN COMPARISON:  None. FINDINGS: CT CHEST  FINDINGS Cardiovascular: The heart size is normal. No pericardial fluid identified. Calcified coronary artery plaque present in a 3 vessel distribution. The thoracic aorta is normal in caliber. Central pulmonary arteries are also normal in caliber. Mediastinum/Nodes: No enlarged mediastinal, hilar, or axillary lymph nodes. Thyroid gland, trachea, and esophagus demonstrate no significant findings. Lungs/Pleura: There is no evidence of pulmonary edema, consolidation, pneumothorax, nodule or pleural fluid. Musculoskeletal: No chest wall mass or suspicious bone lesions identified. CT ABDOMEN PELVIS FINDINGS Hepatobiliary: The liver demonstrates steatosis without evidence of focal lesions, overt cirrhosis or biliary dilatation. The gallbladder is normal. Pancreas: Unremarkable. No pancreatic ductal dilatation or surrounding inflammatory changes. Spleen: Normal in size without focal abnormality. Adrenals/Urinary Tract: Adrenal glands are normal. The kidneys are unremarkable with 1.6 cm lower pole cyst of the left kidney  present. No evidence of solid renal masses or calculi. No hydronephrosis. The bladder is decompressed and unremarkable in appearance. Stomach/Bowel: Bowel shows no evidence of obstruction, ileus, inflammation or visible lesion. No complications are evident after sigmoid colectomy with no evidence of free intraperitoneal air, abnormal fluid collection or colonic stricture. Vascular/Lymphatic: No significant vascular findings are present. No enlarged abdominal or pelvic lymph nodes. Reproductive: Prostate is unremarkable. Other: Tiny left inguinal hernia contains fat.  No free fluid. Musculoskeletal: Degenerative disc disease at the L4-5 level with disc space narrowing and vacuum disc present. No evidence of bony lesions or fractures. IMPRESSION: 1. No evidence of metastatic carcinoma in the chest, abdomen or pelvis. 2. No evidence of complication following recent sigmoid colectomy. 3. Diffuse hepatic steatosis without evidence of cirrhosis or focal liver lesion. 4. Tiny left inguinal hernia containing fat. 5. Coronary atherosclerosis with calcified plaque in a 3 vessel distribution. Electronically Signed   By: Aletta Edouard M.D.   On: 01/13/2019 09:05    ASSESSMENT & PLAN:   Malignant neoplasm of sigmoid colon ([place]) # Stage II sigmoid/colon cancer-PT3N0; Oncotype DX recurrence score 11-corresponding to 88% disease-free survival at 3 years with surgery alone.  20% relative risk reduction with use of 5-FU based chemotherapy.  CT chest abdomen pelvis negative for metastatic disease.  #Given the low risk benefit ratio from adjuvant chemotherapy I would not recommend adjuvant systemic therapy.  This was discussed with the patient in detail.  Also spoke with patient's daughter over the phone.  #Colon cancer surveillance: Will need colonoscopy within a year of resection.  Given the fairly low risk of recurrence would not recommend imaging.  However would recommend CEA every 6 months/this could potentially be  done through PCP.  # fatty liver-incidental noted on the CT scan; discussed small but important risk of progression to cirrhosis over time.  I discussed importance of exercise/weight loss/dietary modifications/and drug therapy/defer to PCP/GI.  # Thank you Dr.Byrnett for allowing me to participate in the care of your pleasant patient. Please do not hesitate to contact me with questions or concerns in the interim.  # 45 minutes face-to-face with the patient discussing the above plan of care; more than 50% of time spent on prognosis/ natural history; counseling and coordination.   # DISPOSITION: # follow with Dr.Byrnett/ Dr.Klein/Dr.Skulskie   All questions were answered. The patient knows to call the clinic with any problems, questions or concerns.    Cammie Sickle, MD 01/23/2019 9:26 AM

## 2019-01-22 NOTE — Assessment & Plan Note (Addendum)
#   Stage II sigmoid/colon cancer-PT3N0; Oncotype DX recurrence score 11-corresponding to 88% disease-free survival at 3 years with surgery alone.  20% relative risk reduction with use of 5-FU based chemotherapy.  CT chest abdomen pelvis negative for metastatic disease.  #Given the low risk benefit ratio from adjuvant chemotherapy I would not recommend adjuvant systemic therapy.  This was discussed with the patient in detail.  Also spoke with patient's daughter over the phone.  #Colon cancer surveillance: Will need colonoscopy within a year of resection.  Given the fairly low risk of recurrence would not recommend imaging.  However would recommend CEA every 6 months/this could potentially be done through PCP.  # fatty liver-incidental noted on the CT scan; discussed small but important risk of progression to cirrhosis over time.  I discussed importance of exercise/weight loss/dietary modifications/and drug therapy/defer to PCP/GI.  # Thank you Dr.Byrnett for allowing me to participate in the care of your pleasant patient. Please do not hesitate to contact me with questions or concerns in the interim.  # 45 minutes face-to-face with the patient discussing the above plan of care; more than 50% of time spent on prognosis/ natural history; counseling and coordination.   # DISPOSITION: # follow with Dr.Byrnett/ Dr.Klein/Dr.Skulskie

## 2019-02-10 ENCOUNTER — Ambulatory Visit (INDEPENDENT_AMBULATORY_CARE_PROVIDER_SITE_OTHER): Payer: PPO | Admitting: General Surgery

## 2019-02-10 ENCOUNTER — Other Ambulatory Visit: Payer: Self-pay

## 2019-02-10 ENCOUNTER — Encounter: Payer: Self-pay | Admitting: General Surgery

## 2019-02-10 VITALS — BP 144/86 | HR 71 | Temp 97.3°F | Ht 71.0 in | Wt 184.0 lb

## 2019-02-10 DIAGNOSIS — C187 Malignant neoplasm of sigmoid colon: Secondary | ICD-10-CM

## 2019-02-10 NOTE — Patient Instructions (Addendum)
  Return on two months..The patient is aware to call back for any questions or concerns.

## 2019-02-10 NOTE — Progress Notes (Signed)
Patient ID: Darin Bell, male   DOB: October 10, 1946, 73 y.o.   MRN: [id number]  Chief Complaint  Patient presents with  . Follow-up    HPI Darin Bell is a 73 y.o. male today to follow up for post op colectomy sx 12/29/2018. Patient states he is doing well and feeling really good. The patient reports that since surgery, and a failed attempted Foley catheter placement, his urinary stream is markedly improved.  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  He underwent evaluation for the possibility of adjuvant chemotherapy with Dr. Rogue Bussing who felt his Oncotype DX score did not warrant adjuvant treatment.  HPI  Past Medical History:  Diagnosis Date  . Benign localized hyperplasia of prostate with urinary obstruction and other lower urinary tract symptoms (LUTS) 04/15/2013  . Cancer ([place])    skin cancer  . Cancer of sigmoid colon ([place]) 12/29/2018   T3, N0, lymphovascular invasion.  Margins clear. No loss of MMR proteins.   . Enlarged prostate with lower urinary tract symptoms (LUTS) 04/15/2013  . Family history of prostate cancer 04/15/2013  . History of degenerative disc disease 07/20/2016   Overview:  Status post C-spine surgery x 2, last in 1996  . Incomplete emptying of bladder 04/15/2013  . Post-void dribbling 04/15/2013  . Pure hypercholesterolemia 09/08/2014  . Retention of urine 04/15/2013    Past Surgical History:  Procedure Laterality Date  . BACK SURGERY  1994   cervical disc  . COLONOSCOPY WITH PROPOFOL N/A 12/16/2018   Procedure: COLONOSCOPY WITH PROPOFOL;  Surgeon: Lollie Sails, MD;  Location: [facility] ENDOSCOPY;  Service: Endoscopy;  Laterality: N/A;  . EYE SURGERY Bilateral    Cataract Extraction with IOL  . HERNIA REPAIR Right 2015   Inguinal Hernia Repair  . LAPAROSCOPIC SIGMOID COLECTOMY N/A 12/29/2018   Procedure: LAPAROSCOPIC SIGMOID COLECTOMY;  Surgeon: Robert Bellow, MD;  Location: [facility] ORS;  Service: General;  Laterality:  N/A;  . TRANSURETHRAL RESECTION OF PROSTATE N/A 08/13/2016   Procedure: TRANSURETHRAL RESECTION OF THE PROSTATE (TURP);  Surgeon: Hollice Espy, MD;  Location: [facility] ORS;  Service: Urology;  Laterality: N/A;    Family History  Problem Relation Age of Onset  . Prostate cancer Father   . Cancer Father   . Diabetes Father   . Kidney cancer Neg Hx     Social History Social History   Tobacco Use  . Smoking status: Never Smoker  . Smokeless tobacco: Never Used  Substance Use Topics  . Alcohol use: Not on file  . Drug use: No    Allergies  Allergen Reactions  . Oxycodone-Acetaminophen     Constipation, Retention per patient  . Ciprofloxacin Diarrhea  . Alfuzosin     Dizziness  . Bactrim [Sulfamethoxazole-Trimethoprim] Other (See Comments)    GI distress  . Dutasteride   . Tamsulosin     Dizziness    Current Outpatient Medications  Medication Sig Dispense Refill  . enoxaparin (LOVENOX) 40 MG/0.4ML injection Inject 0.4 mLs (40 mg total) into the skin daily. 14 Syringe 0  . ibuprofen (ADVIL,MOTRIN) 200 MG tablet Take 1 tablet (200 mg total) by mouth every 4 (four) hours as needed for mild pain. 30 tablet 0  . prednisoLONE acetate (PRED FORTE) 1 % ophthalmic suspension Place 1 drop into the left eye 2 (two) times daily.     No current facility-administered medications for this visit.     Review of Systems Review of Systems  Constitutional: Negative.   Respiratory: Negative.   Cardiovascular: Negative.     Blood pressure (!) 144/86, pulse 71, temperature (!) 97.3 F (36.3 C), temperature source Skin, height '5\' 11"'$  (1.803 m), weight 184 lb (83.5 kg), SpO2 97 %.  Physical Exam Physical Exam Constitutional:      Appearance: Normal appearance.  Neck:     Musculoskeletal: Normal range of motion and neck supple.  Abdominal:    Skin:    General: Skin is warm and dry.  Neurological:     Mental Status: He is oriented to person, place, and time.     Data  Reviewed Oncotype DX: Recurrence score was 11.  Testing was recommended by the [facility] tumor board.  Dr. Aletha Halim consult has been reviewed.  Assessment Doing well status post resection of a descending colon adenocarcinoma.  Plan  Return on two months..The patient is aware to call back for any questions or concerns.  Proper lifting technique once again reviewed.  HPI, Physical Exam, Assessment and Plan have been scribed under the direction and in the presence of Hervey Ard, MD.  Gaspar Cola, CMA  I have completed the exam and reviewed the above documentation for accuracy and completeness.  I agree with the above.  Haematologist has been used and any errors in dictation or transcription are unintentional.  Hervey Ard, M.D., F.A.C.S.   Forest Gleason Khyli Swaim 02/11/2019, 8:49 PM

## 2019-04-14 ENCOUNTER — Other Ambulatory Visit: Payer: Self-pay

## 2019-04-14 ENCOUNTER — Encounter: Payer: Self-pay | Admitting: General Surgery

## 2019-04-14 ENCOUNTER — Ambulatory Visit (INDEPENDENT_AMBULATORY_CARE_PROVIDER_SITE_OTHER): Payer: PPO | Admitting: General Surgery

## 2019-04-14 VITALS — BP 151/78 | HR 72 | Temp 98.1°F | Ht 71.0 in | Wt 184.0 lb

## 2019-04-14 DIAGNOSIS — C187 Malignant neoplasm of sigmoid colon: Secondary | ICD-10-CM | POA: Diagnosis not present

## 2019-04-14 NOTE — Patient Instructions (Addendum)
The patient is aware to call back for any questions or new concerns.  Colonoscopy March 2021

## 2019-04-14 NOTE — Progress Notes (Signed)
Patient ID: Darin Bell, male   DOB: Mar 24, 1946, 73 y.o.   MRN: [id number]  Chief Complaint  Patient presents with  . Follow-up  . Routine Post Op    colectomy sx 12/29/2018    HPI Darin Bell is a 73 y.o. male.  Here for follow up colectomy 12-29-18. Denies any Gi issues, bowels move daily.  HPI  Past Medical History:  Diagnosis Date  . Benign localized hyperplasia of prostate with urinary obstruction and other lower urinary tract symptoms (LUTS) 04/15/2013  . Cancer ([place])    skin cancer  . Cancer of sigmoid colon ([place]) 12/29/2018   T3, N0, lymphovascular invasion.  Margins clear. No loss of MMR proteins.   . Enlarged prostate with lower urinary tract symptoms (LUTS) 04/15/2013  . Family history of prostate cancer 04/15/2013  . History of degenerative disc disease 07/20/2016   Overview:  Status post C-spine surgery x 2, last in 1996  . Incomplete emptying of bladder 04/15/2013  . Post-void dribbling 04/15/2013  . Pure hypercholesterolemia 09/08/2014  . Retention of urine 04/15/2013    Past Surgical History:  Procedure Laterality Date  . BACK SURGERY  1994   cervical disc  . COLONOSCOPY WITH PROPOFOL N/A 12/16/2018   Procedure: COLONOSCOPY WITH PROPOFOL;  Surgeon: Lollie Sails, MD;  Location: [facility] ENDOSCOPY;  Service: Endoscopy;  Laterality: N/A;  . EYE SURGERY Bilateral    Cataract Extraction with IOL  . HERNIA REPAIR Right 2015   Inguinal Hernia Repair  . LAPAROSCOPIC SIGMOID COLECTOMY N/A 12/29/2018   Procedure: LAPAROSCOPIC SIGMOID COLECTOMY;  Surgeon: Robert Bellow, MD;  Location: [facility] ORS;  Service: General;  Laterality: N/A;  . TRANSURETHRAL RESECTION OF PROSTATE N/A 08/13/2016   Procedure: TRANSURETHRAL RESECTION OF THE PROSTATE (TURP);  Surgeon: Hollice Espy, MD;  Location: [facility] ORS;  Service: Urology;  Laterality: N/A;    Family History  Problem Relation Age of Onset  . Prostate cancer Father   . Cancer Father   . Diabetes Father   . Kidney cancer Neg Hx      Social History Social History   Tobacco Use  . Smoking status: Never Smoker  . Smokeless tobacco: Never Used  Substance Use Topics  . Alcohol use: Not on file  . Drug use: No    Allergies  Allergen Reactions  . Oxycodone-Acetaminophen     Constipation, Retention per patient  . Ciprofloxacin Diarrhea  . Alfuzosin     Dizziness  . Bactrim [Sulfamethoxazole-Trimethoprim] Other (See Comments)    GI distress  . Dutasteride   . Tamsulosin     Dizziness    Current Outpatient Medications  Medication Sig Dispense Refill  . ibuprofen (ADVIL,MOTRIN) 200 MG tablet Take 1 tablet (200 mg total) by mouth every 4 (four) hours as needed for mild pain. 30 tablet 0  . prednisoLONE acetate (PRED FORTE) 1 % ophthalmic suspension Place 1 drop into the left eye 2 (two) times daily.     No current facility-administered medications for this visit.     Review of Systems Review of Systems  Constitutional: Negative.   Respiratory: Negative.   Cardiovascular: Negative.     Blood pressure (!) 151/78, pulse 72, temperature 98.1 F (36.7 C), temperature source Temporal, height '5\' 11"'$  (1.803 m), weight 184 lb (83.5 kg), SpO2 96 %.  Physical Exam Physical Exam Exam conducted with a chaperone present.  Constitutional:      Appearance: He is well-developed.  HENT:     Mouth/Throat:  Pharynx: No oropharyngeal exudate.  Eyes:     General: No scleral icterus.    Conjunctiva/sclera: Conjunctivae normal.  Neck:     Musculoskeletal: Neck supple.  Cardiovascular:     Rate and Rhythm: Normal rate and regular rhythm.     Heart sounds: Normal heart sounds.  Pulmonary:     Effort: Pulmonary effort is normal.     Breath sounds: Normal breath sounds.  Abdominal:     General: Bowel sounds are normal.     Palpations: Abdomen is soft.    Lymphadenopathy:     Upper Body:     Left upper body: No supraclavicular adenopathy.  Skin:    General: Skin is warm and dry.  Neurological:     Mental  Status: He is alert and oriented to person, place, and time.  Psychiatric:        Behavior: Behavior normal.     Data Reviewed CEA was 2.8 preoperatively.  Minimal change to 2.0 on postoperative day 10.  Assessment Doing well post sigmoid colectomy.  Plan  The patient is aware to call back for any questions or new concerns. Colonoscopy March 2021.  Dr. Gustavo Lah is tentatively planning to retire in October 2020.  Patient is welcome to return here to have his 1 year anniversary colonoscopy completed or with the remaining physicians with Dr. Marton Redwood group.  HPI, assessment, plan and physical exam has been scribed under the direction and in the presence of Robert Bellow, MD. Karie Fetch, RN  I have completed the exam and reviewed the above documentation for accuracy and completeness.  I agree with the above.  Haematologist has been used and any errors in dictation or transcription are unintentional.  Hervey Ard, M.D., F.A.C.S.  Forest Gleason Byrnett 04/15/2019, 8:49 PM

## 2019-04-15 DIAGNOSIS — H00011 Hordeolum externum right upper eyelid: Secondary | ICD-10-CM | POA: Diagnosis not present

## 2019-04-15 DIAGNOSIS — Z947 Corneal transplant status: Secondary | ICD-10-CM | POA: Diagnosis not present

## 2019-04-15 DIAGNOSIS — Z961 Presence of intraocular lens: Secondary | ICD-10-CM | POA: Diagnosis not present

## 2019-05-14 ENCOUNTER — Encounter: Payer: Self-pay | Admitting: General Surgery

## 2019-05-27 DIAGNOSIS — Z961 Presence of intraocular lens: Secondary | ICD-10-CM | POA: Diagnosis not present

## 2019-05-27 DIAGNOSIS — Z947 Corneal transplant status: Secondary | ICD-10-CM | POA: Diagnosis not present

## 2019-05-27 DIAGNOSIS — H00011 Hordeolum externum right upper eyelid: Secondary | ICD-10-CM | POA: Diagnosis not present

## 2019-07-27 DIAGNOSIS — H9202 Otalgia, left ear: Secondary | ICD-10-CM | POA: Diagnosis not present

## 2019-07-27 DIAGNOSIS — Z85038 Personal history of other malignant neoplasm of large intestine: Secondary | ICD-10-CM | POA: Diagnosis not present

## 2019-07-27 DIAGNOSIS — E785 Hyperlipidemia, unspecified: Secondary | ICD-10-CM | POA: Diagnosis not present

## 2019-07-27 DIAGNOSIS — Z0000 Encounter for general adult medical examination without abnormal findings: Secondary | ICD-10-CM | POA: Diagnosis not present

## 2019-10-07 DIAGNOSIS — H93292 Other abnormal auditory perceptions, left ear: Secondary | ICD-10-CM | POA: Diagnosis not present

## 2019-10-07 DIAGNOSIS — H6123 Impacted cerumen, bilateral: Secondary | ICD-10-CM | POA: Diagnosis not present

## 2019-12-15 ENCOUNTER — Other Ambulatory Visit: Payer: Self-pay | Admitting: General Surgery

## 2019-12-15 DIAGNOSIS — C187 Malignant neoplasm of sigmoid colon: Secondary | ICD-10-CM | POA: Diagnosis not present

## 2019-12-21 ENCOUNTER — Other Ambulatory Visit
Admission: RE | Admit: 2019-12-21 | Discharge: 2019-12-21 | Disposition: A | Discharge status: Home / Self Care | Payer: PPO | Source: Ambulatory Visit | Attending: General Surgery | Admitting: General Surgery

## 2019-12-21 DIAGNOSIS — Z01812 Encounter for preprocedural laboratory examination: Secondary | ICD-10-CM | POA: Diagnosis not present

## 2019-12-21 DIAGNOSIS — Z20822 Contact with and (suspected) exposure to covid-19: Secondary | ICD-10-CM | POA: Insufficient documentation

## 2019-12-22 LAB — SARS CORONAVIRUS 2 (TAT 6-24 HRS): SARS Coronavirus 2: NEGATIVE

## 2019-12-23 ENCOUNTER — Ambulatory Visit
Admission: RE | Admit: 2019-12-23 | Discharge: 2019-12-23 | Disposition: A | Discharge status: Home / Self Care | Payer: PPO | Attending: General Surgery | Admitting: General Surgery

## 2019-12-23 ENCOUNTER — Ambulatory Visit: Payer: PPO | Admitting: Certified Registered Nurse Anesthetist

## 2019-12-23 ENCOUNTER — Other Ambulatory Visit: Payer: Self-pay

## 2019-12-23 ENCOUNTER — Encounter
Admission: RE | Disposition: A | Discharge status: Home / Self Care | Payer: Self-pay | Source: Home / Self Care | Attending: General Surgery

## 2019-12-23 DIAGNOSIS — D120 Benign neoplasm of cecum: Secondary | ICD-10-CM | POA: Diagnosis not present

## 2019-12-23 DIAGNOSIS — K635 Polyp of colon: Secondary | ICD-10-CM | POA: Diagnosis not present

## 2019-12-23 DIAGNOSIS — Z85828 Personal history of other malignant neoplasm of skin: Secondary | ICD-10-CM | POA: Diagnosis not present

## 2019-12-23 DIAGNOSIS — Z85038 Personal history of other malignant neoplasm of large intestine: Secondary | ICD-10-CM | POA: Insufficient documentation

## 2019-12-23 DIAGNOSIS — Z08 Encounter for follow-up examination after completed treatment for malignant neoplasm: Secondary | ICD-10-CM | POA: Insufficient documentation

## 2019-12-23 DIAGNOSIS — K639 Disease of intestine, unspecified: Secondary | ICD-10-CM | POA: Diagnosis not present

## 2019-12-23 DIAGNOSIS — Z7982 Long term (current) use of aspirin: Secondary | ICD-10-CM | POA: Insufficient documentation

## 2019-12-23 DIAGNOSIS — Z1211 Encounter for screening for malignant neoplasm of colon: Secondary | ICD-10-CM | POA: Diagnosis not present

## 2019-12-23 HISTORY — PX: COLONOSCOPY WITH PROPOFOL: SHX5780

## 2019-12-23 SURGERY — COLONOSCOPY WITH PROPOFOL
Anesthesia: General

## 2019-12-23 MED ORDER — PROPOFOL 10 MG/ML IV BOLUS
INTRAVENOUS | Status: DC | PRN
  Administered 2019-12-23: 150 ug/kg/min via INTRAVENOUS

## 2019-12-23 MED ORDER — PHENYLEPHRINE HCL (PRESSORS) 10 MG/ML IV SOLN
INTRAVENOUS | Status: AC
  Filled 2019-12-23: qty 1

## 2019-12-23 MED ORDER — GLYCOPYRROLATE 0.2 MG/ML IJ SOLN
INTRAMUSCULAR | Status: AC
  Filled 2019-12-23: qty 1

## 2019-12-23 MED ORDER — PROPOFOL 500 MG/50ML IV EMUL
INTRAVENOUS | Status: AC
  Filled 2019-12-23: qty 200

## 2019-12-23 MED ORDER — PROPOFOL 10 MG/ML IV BOLUS
INTRAVENOUS | Status: AC
  Filled 2019-12-23: qty 20

## 2019-12-23 MED ORDER — SODIUM CHLORIDE 0.9 % IV SOLN
INTRAVENOUS | Status: DC
  Administered 2019-12-23: 1000 mL via INTRAVENOUS

## 2019-12-23 NOTE — Op Note (Signed)
[facility] Patient Name: Darin Bell Procedure Date: 12/23/2019 7:22 AM MRN: [id number] Account #: [id number] Date of Birth: 05-16-1946 Admit Type: Outpatient Age: 74 Room: [facility] ENDO ROOM 1 Gender: Male Note Status: Finalized Procedure:             Colonoscopy Indications:           High risk colon cancer surveillance: Personal history                         of colon cancer Providers:             Robert Bellow, MD Referring MD:          Ramonita Lab, MD (Referring MD) Medicines:             Monitored Anesthesia Care Complications:         No immediate complications. Procedure:             Pre-Anesthesia Assessment:                        - Prior to the procedure, a History and Physical was                         performed, and patient medications, allergies and                         sensitivities were reviewed. The patient's tolerance                         of previous anesthesia was reviewed.                        - The risks and benefits of the procedure and the                         sedation options and risks were discussed with the                         patient. All questions were answered and informed                         consent was obtained.                        After obtaining informed consent, the colonoscope was                         passed under direct vision. Throughout the procedure,                         the patient's blood pressure, pulse, and oxygen                         saturations were monitored continuously. The                         Colonoscope was introduced through the anus and                         advanced to the the cecum, identified  by appendiceal                         orifice and ileocecal valve. The colonoscopy was                         performed without difficulty. The patient tolerated                         the procedure well. The quality of the bowel   preparation was adequate to identify polyps. Findings:      Two sessile and semi-pedunculated polyps were found in the ascending       colon and cecum. The polyps were 12 to 25 mm in size. These polyps were       removed with a hot snare. Resection and retrieval were complete. To       prevent bleeding after the polypectomy, two hemostatic clips were       successfully placed (MR conditional). There was no bleeding at the end       of the procedure.      The retroflexed view of the distal rectum and anal verge was normal and       showed no anal or rectal abnormalities. Impression:            - Two 12 to 25 mm polyps in the ascending colon and in                         the cecum, removed with a hot snare. Resected and                         retrieved. Clips (MR conditional) were placed.                        - The distal rectum and anal verge are normal on                         retroflexion view. Recommendation:        - Telephone endoscopist for pathology results in 1                         week. Procedure Code(s):     --- Professional ---                        [phone], Colonoscopy, flexible; with removal of                         tumor(s), polyp(s), or other lesion(s) by snare                         technique Diagnosis Code(s):     --- Professional ---                        K93.267, Personal history of other malignant neoplasm                         of large intestine CPT copyright 2019 American Medical Association. All rights reserved. The codes documented in this report are preliminary and upon coder review may  be  revised to meet current compliance requirements. Robert Bellow, MD 12/23/2019 8:31:31 AM This report has been signed electronically. Number of Addenda: 0 Note Initiated On: 12/23/2019 7:22 AM Scope Withdrawal Time: 0 hours 40 minutes 1 second  Total Procedure Duration: 0 hours 49 minutes 29 seconds  Estimated Blood Loss:  Estimated blood loss: none.       [facility]

## 2019-12-23 NOTE — H&P (Signed)
Darin Bell [id number] 14-Jan-1946     HPI: One year s/p resection of a T3, N0 sigmoid cancer. For follow up colonoscopy. Tolerated prep well.   Medications Prior to Admission  Medication Sig Dispense Refill Last Dose  . aspirin EC 81 MG tablet Take 81 mg by mouth daily.   12/22/2019 at Unknown time  . ibuprofen (ADVIL,MOTRIN) 200 MG tablet Take 1 tablet (200 mg total) by mouth every 4 (four) hours as needed for mild pain. 30 tablet 0 Past Week at Unknown time  . prednisoLONE acetate (PRED FORTE) 1 % ophthalmic suspension Place 1 drop into the left eye 2 (two) times daily.   12/22/2019 at Unknown time   Allergies  Allergen Reactions  . Oxycodone-Acetaminophen     Constipation, Retention per patient  . Ciprofloxacin Diarrhea  . Alfuzosin     Dizziness  . Bactrim [Sulfamethoxazole-Trimethoprim] Other (See Comments)    GI distress  . Dutasteride   . Tamsulosin     Dizziness   Past Medical History:  Diagnosis Date  . Benign localized hyperplasia of prostate with urinary obstruction and other lower urinary tract symptoms (LUTS) 04/15/2013  . Cancer ([place])    skin cancer  . Cancer of sigmoid colon ([place]) 12/29/2018   T3, N0, lymphovascular invasion.  Margins clear. No loss of MMR proteins.   . Enlarged prostate with lower urinary tract symptoms (LUTS) 04/15/2013  . Family history of prostate cancer 04/15/2013  . History of degenerative disc disease 07/20/2016   Overview:  Status post C-spine surgery x 2, last in 1996  . Incomplete emptying of bladder 04/15/2013  . Post-void dribbling 04/15/2013  . Pure hypercholesterolemia 09/08/2014  . Retention of urine 04/15/2013   Past Surgical History:  Procedure Laterality Date  . BACK SURGERY  1994   cervical disc  . COLONOSCOPY WITH PROPOFOL N/A 12/16/2018   Procedure: COLONOSCOPY WITH PROPOFOL;  Surgeon: Lollie Sails, MD;  Location: [facility] ENDOSCOPY;  Service: Endoscopy;  Laterality: N/A;  . EYE SURGERY Bilateral    Cataract Extraction with IOL  .  HERNIA REPAIR Right 2015   Inguinal Hernia Repair  . LAPAROSCOPIC SIGMOID COLECTOMY N/A 12/29/2018   Procedure: LAPAROSCOPIC SIGMOID COLECTOMY;  Surgeon: Robert Bellow, MD;  Location: [facility] ORS;  Service: General;  Laterality: N/A;  . TRANSURETHRAL RESECTION OF PROSTATE N/A 08/13/2016   Procedure: TRANSURETHRAL RESECTION OF THE PROSTATE (TURP);  Surgeon: Hollice Espy, MD;  Location: [facility] ORS;  Service: Urology;  Laterality: N/A;   Social History   Socioeconomic History  . Marital status: Single    Spouse name: Not on file  . Number of children: 3  . Years of education: Not on file  . Highest education level: Not on file  Occupational History  . Not on file  Tobacco Use  . Smoking status: Never Smoker  . Smokeless tobacco: Never Used  Substance and Sexual Activity  . Alcohol use: Not on file  . Drug use: No  . Sexual activity: Not on file  Other Topics Concern  . 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 [name]; lives with wife.    Social Determinants of Health   Financial Resource Strain:   . Difficulty of Paying Living Expenses: Not on file  Food Insecurity:   . Worried About Charity fundraiser in the Last Year: Not on file  . Ran Out of Food in the Last Year: Not on file  Transportation Needs:   .  Lack of Transportation (Medical): Not on file  . Lack of Transportation (Non-Medical): Not on file  Physical Activity:   . Days of Exercise per Week: Not on file  . Minutes of Exercise per Session: Not on file  Stress:   . Feeling of Stress : Not on file  Social Connections:   . Frequency of Communication with Friends and Family: Not on file  . Frequency of Social Gatherings with Friends and Family: Not on file  . Attends Religious Services: Not on file  . Active Member of Clubs or Organizations: Not on file  . Attends Archivist Meetings: Not on file  . Marital Status: Not on file  Intimate Partner Violence:   . Fear of  Current or Ex-Partner: Not on file  . Emotionally Abused: Not on file  . Physically Abused: Not on file  . Sexually Abused: Not on file   Social History   Social History Narrative   [name]; no alcohol; no smoking. [name]; lives with wife.      ROS: Negative.     PE: HEENT: Negative. Lungs: Clear. Cardio: RR.  Assessment/Plan:  Proceed with planned endoscopy.   Forest Gleason [facility] 12/23/2019

## 2019-12-23 NOTE — Transfer of Care (Signed)
Immediate Anesthesia Transfer of Care Note  Patient: Darin Bell  Procedure(s) Performed: COLONOSCOPY WITH PROPOFOL (N/A )  Patient Location: PACU  Anesthesia Type:General  Level of Consciousness: sedated  Airway & Oxygen Therapy: Patient Spontanous Breathing and Patient connected to nasal cannula oxygen  Post-op Assessment: Report given to RN and Post -op Vital signs reviewed and stable  Post vital signs: Reviewed and stable  Last Vitals:  Vitals Value Taken Time  BP 113/83 12/23/19 0832  Temp    Pulse 73 12/23/19 0832  Resp 14 12/23/19 0832  SpO2 97 % 12/23/19 0832  Vitals shown include unvalidated device data.  Last Pain:  Vitals:   12/23/19 0650  TempSrc: Temporal  PainSc: 0-No pain         Complications: No apparent anesthesia complications

## 2019-12-23 NOTE — Anesthesia Postprocedure Evaluation (Signed)
Anesthesia Post Note  Patient: Darin Bell  Procedure(s) Performed: COLONOSCOPY WITH PROPOFOL (N/A )  Patient location during evaluation: Endoscopy Anesthesia Type: General Level of consciousness: awake and alert Pain management: pain level controlled Vital Signs Assessment: post-procedure vital signs reviewed and stable Respiratory status: spontaneous breathing and respiratory function stable Cardiovascular status: stable Anesthetic complications: no     Last Vitals:  Vitals:   12/23/19 0831 12/23/19 0841  BP: 113/83   Pulse: 77 68  Resp: 18 15  Temp: (!) 35.8 C   SpO2: 97% 98%    Last Pain:  Vitals:   12/23/19 0841  TempSrc:   PainSc: 0-No pain                 Cydney Alvarenga K

## 2019-12-23 NOTE — Brief Op Note (Signed)
Lab at bedside to draw CEA per MD order before discharge from endoscopy.

## 2019-12-23 NOTE — Anesthesia Preprocedure Evaluation (Signed)
Anesthesia Evaluation  Patient identified by MRN, date of birth, ID band Patient awake    Reviewed: Allergy & Precautions, NPO status , Patient's Chart, lab work & pertinent test results  History of Anesthesia Complications (+) PONV and history of anesthetic complications  Airway Mallampati: II       Dental   Pulmonary neg sleep apnea, neg COPD, Not current smoker,           Cardiovascular (-) hypertension(-) Past MI and (-) CHF (-) dysrhythmias (-) Valvular Problems/Murmurs     Neuro/Psych neg Seizures    GI/Hepatic Neg liver ROS, neg GERD  ,  Endo/Other  neg diabetes  Renal/GU negative Renal ROS     Musculoskeletal   Abdominal   Peds  Hematology   Anesthesia Other Findings   Reproductive/Obstetrics                             Anesthesia Physical Anesthesia Plan  ASA: I  Anesthesia Plan: General   Post-op Pain Management:    Induction: Intravenous  PONV Risk Score and Plan: 3 and Propofol infusion, TIVA and Ondansetron  Airway Management Planned: Nasal Cannula  Additional Equipment:   Intra-op Plan:   Post-operative Plan:   Informed Consent: I have reviewed the patients History and Physical, chart, labs and discussed the procedure including the risks, benefits and alternatives for the proposed anesthesia with the patient or authorized representative who has indicated his/her understanding and acceptance.       Plan Discussed with:   Anesthesia Plan Comments:         Anesthesia Quick Evaluation

## 2019-12-23 NOTE — Anesthesia Procedure Notes (Signed)
Performed by: Demetrius Charity, CRNA Pre-anesthesia Checklist: Emergency Drugs available, Patient identified, Suction available, Timeout performed and Patient being monitored Patient Re-evaluated:Patient Re-evaluated prior to induction Oxygen Delivery Method: Nasal cannula Induction Type: IV induction

## 2019-12-24 ENCOUNTER — Encounter: Payer: Self-pay | Admitting: *Deleted

## 2019-12-24 LAB — CEA: CEA: 1.7 ng/mL (ref 0.0–4.7)

## 2019-12-24 LAB — SURGICAL PATHOLOGY

## 2020-01-18 DIAGNOSIS — Z85038 Personal history of other malignant neoplasm of large intestine: Secondary | ICD-10-CM | POA: Diagnosis not present

## 2020-01-18 DIAGNOSIS — E785 Hyperlipidemia, unspecified: Secondary | ICD-10-CM | POA: Diagnosis not present

## 2020-01-25 DIAGNOSIS — Z8739 Personal history of other diseases of the musculoskeletal system and connective tissue: Secondary | ICD-10-CM | POA: Diagnosis not present

## 2020-01-25 DIAGNOSIS — Z85038 Personal history of other malignant neoplasm of large intestine: Secondary | ICD-10-CM | POA: Diagnosis not present

## 2020-01-25 DIAGNOSIS — Z0000 Encounter for general adult medical examination without abnormal findings: Secondary | ICD-10-CM | POA: Diagnosis not present

## 2020-01-25 DIAGNOSIS — E7800 Pure hypercholesterolemia, unspecified: Secondary | ICD-10-CM | POA: Diagnosis not present

## 2020-04-15 DIAGNOSIS — H02052 Trichiasis without entropian right lower eyelid: Secondary | ICD-10-CM | POA: Diagnosis not present

## 2020-07-12 DIAGNOSIS — Z961 Presence of intraocular lens: Secondary | ICD-10-CM | POA: Diagnosis not present

## 2020-09-02 DIAGNOSIS — H6123 Impacted cerumen, bilateral: Secondary | ICD-10-CM | POA: Diagnosis not present

## 2020-09-02 DIAGNOSIS — H93292 Other abnormal auditory perceptions, left ear: Secondary | ICD-10-CM | POA: Diagnosis not present

## 2020-10-18 ENCOUNTER — Other Ambulatory Visit: Payer: Self-pay

## 2020-10-18 ENCOUNTER — Encounter: Payer: Self-pay | Admitting: Dermatology

## 2020-10-18 ENCOUNTER — Ambulatory Visit: Payer: PPO | Admitting: Dermatology

## 2020-10-18 DIAGNOSIS — L578 Other skin changes due to chronic exposure to nonionizing radiation: Secondary | ICD-10-CM | POA: Diagnosis not present

## 2020-10-18 DIAGNOSIS — L570 Actinic keratosis: Secondary | ICD-10-CM | POA: Diagnosis not present

## 2020-10-18 DIAGNOSIS — L821 Other seborrheic keratosis: Secondary | ICD-10-CM | POA: Diagnosis not present

## 2020-10-18 DIAGNOSIS — L820 Inflamed seborrheic keratosis: Secondary | ICD-10-CM | POA: Diagnosis not present

## 2020-10-18 DIAGNOSIS — Z85828 Personal history of other malignant neoplasm of skin: Secondary | ICD-10-CM

## 2020-10-18 NOTE — Patient Instructions (Signed)

## 2020-10-18 NOTE — Progress Notes (Signed)
   Follow-Up Visit   Subjective  Darin Bell is a 75 y.o. male who presents for the following: spots (Anterior neck, scalp. Present for around 1 year. Patient picks at spots. He has a history of SCC of the left forearm.).  He has a h/o AKs on hands and ears.   The following portions of the chart were reviewed this encounter and updated as appropriate:       Review of Systems:  No other skin or systemic complaints except as noted in HPI or Assessment and Plan.  Objective  Well appearing patient in no apparent distress; mood and affect are within normal limits.  A focused examination was performed including face, scalp. Relevant physical exam findings are noted in the Assessment and Plan.  Objective  L inferior vertex scalp, R neck x 1, upper sternum x 4 (5): Erythematous keratotic or waxy stuck-on papule   Objective  BL Dorsal Hands, L ear helix x 4, R ear helix x 2 (6): Erythematous thin papules/macules with gritty scale.    Assessment & Plan    Actinic Damage - chronic, secondary to cumulative UV radiation exposure/sun exposure over time - diffuse scaly erythematous macules with underlying dyspigmentation - Recommend daily broad spectrum sunscreen SPF 30+ to sun-exposed areas, reapply every 2 hours as needed.  - Call for new or changing lesions.  History of Squamous Cell Carcinoma of the Skin - No evidence of recurrence today of the left forearm - No lymphadenopathy - Recommend regular full body skin exams - Recommend daily broad spectrum sunscreen SPF 30+ to sun-exposed areas, reapply every 2 hours as needed.  - Call if any new or changing lesions are noted between office visits  Seborrheic Keratoses -Waxy flesh papule, 1.0cm of the left inferior vertex - Discussed benign etiology and prognosis. Discussed cryotherapy treatment, patient defers today. - Observe - Call for any changes  Inflamed seborrheic keratosis (6) R neck x 1, upper sternum x 4 (5); L inferior  vertex scalp  Pt defers treatment of scalp ISK today, may treat on f/up  Destruction of lesion - R neck x 1, upper sternum x 4  Destruction method: cryotherapy   Informed consent: discussed and consent obtained   Lesion destroyed using liquid nitrogen: Yes   Region frozen until ice ball extended beyond lesion: Yes   Outcome: patient tolerated procedure well with no complications   Post-procedure details: wound care instructions given   Additional details:  Prior to procedure, discussed risks of blister formation, small wound, skin dyspigmentation, or rare scar following cryotherapy.  AK (actinic keratosis) (7) L ear helix x 4, R ear helix x 2 (6); BL Dorsal Hands  Will treat AKs on bil hand dorsum on follow-up, patient defers today.  Destruction of lesion - L ear helix x 4, R ear helix x 2  Destruction method: cryotherapy   Informed consent: discussed and consent obtained   Lesion destroyed using liquid nitrogen: Yes   Region frozen until ice ball extended beyond lesion: Yes   Outcome: patient tolerated procedure well with no complications   Post-procedure details: wound care instructions given    Return in about 3 months (around 01/16/2021) for AKs, ISKs.  IJamesetta Orleans, CMA, am acting as scribe for Brendolyn Patty, MD .  Documentation: I have reviewed the above documentation for accuracy and completeness, and I agree with the above.  Brendolyn Patty MD

## 2020-12-15 ENCOUNTER — Other Ambulatory Visit: Payer: Self-pay | Admitting: General Surgery

## 2020-12-15 DIAGNOSIS — Z85038 Personal history of other malignant neoplasm of large intestine: Secondary | ICD-10-CM | POA: Diagnosis not present

## 2020-12-15 NOTE — Progress Notes (Signed)
Subjective:     Patient ID: Darin Bell is a 75 y.o. male.  HPI  The following portions of the patient's history were reviewed and updated as appropriate.  This an established patient is here today for: office visit. He is here to discuss having a colonoscopy, last completed 12-23-19. He states he is doing well. Bowels moving regular, no bleeding.   Review of Systems  Constitutional: Negative for chills and fever.  Respiratory: Negative for cough.   Gastrointestinal: Negative for anal bleeding, constipation and diarrhea.       Chief Complaint  Patient presents with  . Follow-up     BP (!) 146/90   Pulse 81   Temp 36.3 C (97.3 F)   Ht 180.3 cm (5\' 11" )   Wt 83.9 kg (185 lb)   SpO2 96%   BMI 25.80 kg/m       Past Medical History:  Diagnosis Date  . BPH (benign prostatic hypertrophy)   . History of colon cancer 2020   Status post hemicolectomy; evaluated by oncology, with no adjuvant therapy required  . History of degenerative disc disease    Status post C-spine surgery x 2, last in 1996  . Hyperlipidemia   . Squamous cell cancer of skin of left forearm 2011  . Sun-damaged skin    previously evaluated by Dermatology     Past Surgical History:  Procedure Laterality Date  . CATARACT EXTRACTION Bilateral 2014  . Cervical spine surgery     x 2, last in 1996  . COLON SURGERY  2020   Hemicolectomy for stage II colon cancer  . COLONOSCOPY  12/16/2018   Invasive colorectal adenomcarcinoma/Repeat 77yr/MUS  . COLONOSCOPY  12/23/2019  . HERNIA REPAIR     right inguinal 12/14, Dr Rochel Brome  . transurethral resection of prostate  08/13/2016       Social History          Socioeconomic History  . Marital status: Married    Spouse name: Not on file  . Number of children: Not on file  . Years of education: Not on file  . Highest education level: Not on file  Occupational History  . Not on file  Tobacco Use  . Smoking  status: Never Smoker  . Smokeless tobacco: Never Used  Substance and Sexual Activity  . Alcohol use: No  . Drug use: Never  . Sexual activity: Not on file  Other Topics Concern  . Not on file  Social History Narrative  . Not on file   Social Determinants of Health   Financial Resource Strain: Not on file  Food Insecurity: Not on file  Transportation Needs: Not on file            Allergies  Allergen Reactions  . Oxycodone-Acetaminophen Unknown    Constipation, Retention per patient  . Ciprofloxacin Diarrhea  . Avodart [Dutasteride] Other (See Comments)    intolerant  . Flomax [Tamsulosin] Dizziness  . Sulfamethoxazole-Trimethoprim Other (See Comments)    GI distress  . Uroxatral [Alfuzosin] Dizziness    Current Medications        Current Outpatient Medications  Medication Sig Dispense Refill  . prednisoLONE acetate (PRED FORTE) 1 % ophthalmic suspension Place 1 drop into both eyes once daily       . aspirin 81 MG EC tablet Take by mouth (Patient not taking: Reported on 12/15/2020  )     No current facility-administered medications for this visit.  Family History  Problem Relation Age of Onset  . Pulmonary fibrosis Mother   . Diabetes Father   . Prostate cancer Father        in his 83s  . Liver cancer Brother   . Benign prostatic hyperplasia Brother   . Colon polyps Brother         Objective:   Physical Exam Constitutional:      Appearance: Normal appearance.  Cardiovascular:     Rate and Rhythm: Normal rate and regular rhythm.     Pulses: Normal pulses.     Heart sounds: Normal heart sounds.  Pulmonary:     Effort: Pulmonary effort is normal.     Breath sounds: Normal breath sounds.  Abdominal:       Comments: Well-healed lower midline incision.  Musculoskeletal:     Cervical back: Neck supple.  Skin:    General: Skin is warm and dry.  Neurological:     Mental Status: He is alert and oriented to person,  place, and time.  Psychiatric:        Mood and Affect: Mood normal.        Behavior: Behavior normal.    Labs and Radiology:   December 23, 2019 colonoscopy / pathology:  Two sessile and semi-pedunculated polyps were found in the ascending colon and cecum. The polyps were 12 to 25 mm in size. These polyps were removed with a hot snare. Resection and retrieval were complete. To prevent bleeding after the polypectomy, two hemostatic clips were successfully placed (MR conditional). There was no bleeding at the end of the procedure.   DIAGNOSIS:  A. COLON POLYPS X2, CECUM; HOT SNARE:  - MULTIPLE FRAGMENTS OF TUBULOVILLOUS ADENOMAS WITH VERY FOCAL  HIGH-GRADE DYSPLASIA.  - NEGATIVE FOR MALIGNANCY.    Based on the [place] multisociety task force recommendations for colonoscopy follow-up of 2020, repeat exam is indicated at this time as the polyps from the right colon were removed in a piecemeal fashion and above 20 mm in diameter.      Assessment:     Doing well now 2 years post resection of a T3, N0 sigmoid colon tumor.  1 year post polypectomy (proximal: Not evaluated prior to colon resection secondary to near obstructing nature).    Plan:     Reviewed indications for repeat colonoscopy.  Patient is amenable to proceed.     Entered by Karie Fetch, RN, acting as a scribe for Dr. Hervey Ard, MD.  The documentation recorded by the scribe accurately reflects the service I personally performed and the decisions made by me.   Robert Bellow, MD FACS

## 2020-12-19 ENCOUNTER — Other Ambulatory Visit: Payer: Self-pay

## 2020-12-19 ENCOUNTER — Other Ambulatory Visit
Admission: RE | Admit: 2020-12-19 | Discharge: 2020-12-19 | Disposition: A | Discharge status: Home / Self Care | Payer: PPO | Source: Ambulatory Visit | Attending: General Surgery | Admitting: General Surgery

## 2020-12-19 DIAGNOSIS — Z20822 Contact with and (suspected) exposure to covid-19: Secondary | ICD-10-CM | POA: Diagnosis not present

## 2020-12-19 DIAGNOSIS — Z01812 Encounter for preprocedural laboratory examination: Secondary | ICD-10-CM | POA: Insufficient documentation

## 2020-12-20 ENCOUNTER — Encounter: Payer: Self-pay | Admitting: General Surgery

## 2020-12-20 LAB — SARS CORONAVIRUS 2 (TAT 6-24 HRS): SARS Coronavirus 2: NEGATIVE

## 2020-12-21 ENCOUNTER — Ambulatory Visit: Payer: PPO | Admitting: Certified Registered Nurse Anesthetist

## 2020-12-21 ENCOUNTER — Encounter
Admission: RE | Disposition: A | Discharge status: Home / Self Care | Payer: Self-pay | Source: Home / Self Care | Attending: General Surgery

## 2020-12-21 ENCOUNTER — Other Ambulatory Visit: Payer: Self-pay

## 2020-12-21 ENCOUNTER — Ambulatory Visit
Admission: RE | Admit: 2020-12-21 | Discharge: 2020-12-21 | Disposition: A | Discharge status: Home / Self Care | Payer: PPO | Attending: General Surgery | Admitting: General Surgery

## 2020-12-21 DIAGNOSIS — Z85038 Personal history of other malignant neoplasm of large intestine: Secondary | ICD-10-CM | POA: Diagnosis not present

## 2020-12-21 DIAGNOSIS — Z85828 Personal history of other malignant neoplasm of skin: Secondary | ICD-10-CM | POA: Diagnosis not present

## 2020-12-21 DIAGNOSIS — Z9049 Acquired absence of other specified parts of digestive tract: Secondary | ICD-10-CM | POA: Diagnosis not present

## 2020-12-21 DIAGNOSIS — Z86010 Personal history of colonic polyps: Secondary | ICD-10-CM | POA: Insufficient documentation

## 2020-12-21 DIAGNOSIS — Z08 Encounter for follow-up examination after completed treatment for malignant neoplasm: Secondary | ICD-10-CM | POA: Diagnosis not present

## 2020-12-21 DIAGNOSIS — Z7982 Long term (current) use of aspirin: Secondary | ICD-10-CM | POA: Insufficient documentation

## 2020-12-21 DIAGNOSIS — D120 Benign neoplasm of cecum: Secondary | ICD-10-CM | POA: Insufficient documentation

## 2020-12-21 DIAGNOSIS — K635 Polyp of colon: Secondary | ICD-10-CM | POA: Diagnosis not present

## 2020-12-21 DIAGNOSIS — D122 Benign neoplasm of ascending colon: Secondary | ICD-10-CM | POA: Insufficient documentation

## 2020-12-21 DIAGNOSIS — Z1211 Encounter for screening for malignant neoplasm of colon: Secondary | ICD-10-CM | POA: Diagnosis not present

## 2020-12-21 HISTORY — PX: COLONOSCOPY WITH PROPOFOL: SHX5780

## 2020-12-21 HISTORY — DX: Personal history of other malignant neoplasm of large intestine: Z85.038

## 2020-12-21 HISTORY — DX: Hyperlipidemia, unspecified: E78.5

## 2020-12-21 HISTORY — DX: Benign prostatic hyperplasia without lower urinary tract symptoms: N40.0

## 2020-12-21 SURGERY — COLONOSCOPY WITH PROPOFOL
Anesthesia: General

## 2020-12-21 MED ORDER — LIDOCAINE HCL (PF) 2 % IJ SOLN
INTRAMUSCULAR | Status: AC
  Filled 2020-12-21: qty 5

## 2020-12-21 MED ORDER — PROPOFOL 500 MG/50ML IV EMUL
INTRAVENOUS | Status: AC
  Filled 2020-12-21: qty 50

## 2020-12-21 MED ORDER — SODIUM CHLORIDE 0.9 % IV SOLN
INTRAVENOUS | Status: DC
  Administered 2020-12-21: 20 mL/h via INTRAVENOUS

## 2020-12-21 MED ORDER — LIDOCAINE HCL (CARDIAC) PF 100 MG/5ML IV SOSY
PREFILLED_SYRINGE | INTRAVENOUS | Status: DC | PRN
  Administered 2020-12-21: 50 mg via INTRAVENOUS

## 2020-12-21 MED ORDER — PROPOFOL 10 MG/ML IV BOLUS
INTRAVENOUS | Status: DC | PRN
  Administered 2020-12-21: 30 mg via INTRAVENOUS
  Administered 2020-12-21: 70 mg via INTRAVENOUS

## 2020-12-21 MED ORDER — PROPOFOL 500 MG/50ML IV EMUL
INTRAVENOUS | Status: DC | PRN
  Administered 2020-12-21: 150 ug/kg/min via INTRAVENOUS

## 2020-12-21 MED ORDER — PHENYLEPHRINE HCL (PRESSORS) 10 MG/ML IV SOLN
INTRAVENOUS | Status: AC
  Filled 2020-12-21: qty 1

## 2020-12-21 NOTE — Anesthesia Preprocedure Evaluation (Signed)
Anesthesia Evaluation  Patient identified by MRN, date of birth, ID band Patient awake    Reviewed: Allergy & Precautions, NPO status , Patient's Chart, lab work & pertinent test results  History of Anesthesia Complications Negative for: history of anesthetic complications  Airway Mallampati: II  TM Distance: >3 FB Neck ROM: Full    Dental no notable dental hx.    Pulmonary neg pulmonary ROS, neg sleep apnea, neg COPD,    breath sounds clear to auscultation- rhonchi (-) wheezing      Cardiovascular Exercise Tolerance: Good (-) hypertension(-) CAD, (-) Past MI, (-) Cardiac Stents and (-) CABG  Rhythm:Regular Rate:Normal - Systolic murmurs and - Diastolic murmurs    Neuro/Psych neg Seizures negative neurological ROS  negative psych ROS   GI/Hepatic negative GI ROS, Neg liver ROS,   Endo/Other  negative endocrine ROSneg diabetes  Renal/GU negative Renal ROS     Musculoskeletal negative musculoskeletal ROS (+)   Abdominal (+) - obese,   Peds  Hematology negative hematology ROS (+)   Anesthesia Other Findings Past Medical History: 04/15/2013: Benign localized hyperplasia of prostate with urinary  obstruction and other lower urinary tract symptoms (LUTS) No date: BPH (benign prostatic hyperplasia) No date: Cancer [facility])     Comment:  skin cancer 12/29/2018: Cancer of sigmoid colon (HCC)     Comment:  T3, N0, lymphovascular invasion.  Margins clear. No loss              of MMR proteins.  04/15/2013: Enlarged prostate with lower urinary tract symptoms (LUTS) 04/15/2013: Family history of prostate cancer No date: History of colon cancer 07/20/2016: History of degenerative disc disease     Comment:  Overview:  Status post C-spine surgery x 2, last in 1996 No date: Hyperlipidemia 04/15/2013: Incomplete emptying of bladder 04/15/2013: Post-void dribbling 09/08/2014: Pure hypercholesterolemia 04/15/2013: Retention of urine 2011:  Squamous cell carcinoma of skin     Comment:  left forearm   Reproductive/Obstetrics                             Anesthesia Physical Anesthesia Plan  ASA: II  Anesthesia Plan: General   Post-op Pain Management:    Induction: Intravenous  PONV Risk Score and Plan: 1 and Propofol infusion  Airway Management Planned: Natural Airway  Additional Equipment:   Intra-op Plan:   Post-operative Plan:   Informed Consent: I have reviewed the patients History and Physical, chart, labs and discussed the procedure including the risks, benefits and alternatives for the proposed anesthesia with the patient or authorized representative who has indicated his/her understanding and acceptance.     Dental advisory given  Plan Discussed with: CRNA and Anesthesiologist  Anesthesia Plan Comments:         Anesthesia Quick Evaluation

## 2020-12-21 NOTE — Op Note (Signed)
[facility] Gastroenterology Patient Name: Darin Bell Procedure Date: 12/21/2020 7:29 AM MRN: [id number] Account #: [id number] Date of Birth: 04/30/1946 Admit Type: Outpatient Age: 75 Room: [facility] ENDO ROOM 1 Gender: Male Note Status: Finalized Procedure:             Colonoscopy Indications:           High risk colon cancer surveillance: Personal history                         of colonic polyps, High risk colon cancer                         surveillance: Personal history of colon cancer Providers:             Robert Bellow, MD Referring MD:          Ramonita Lab, MD (Referring MD) Medicines:             Monitored Anesthesia Care Complications:         No immediate complications. Procedure:             Pre-Anesthesia Assessment:                        - Prior to the procedure, a History and Physical was                         performed, and patient medications, allergies and                         sensitivities were reviewed. The patient's tolerance                         of previous anesthesia was reviewed.                        - The risks and benefits of the procedure and the                         sedation options and risks were discussed with the                         patient. All questions were answered and informed                         consent was obtained.                        After obtaining informed consent, the colonoscope was                         passed under direct vision. Throughout the procedure,                         the patient's blood pressure, pulse, and oxygen                         saturations were monitored continuously. The                         Colonoscope was introduced  through the anus and                         advanced to the the cecum, identified by appendiceal                         orifice and ileocecal valve. The colonoscopy was                         performed without difficulty. The patient tolerated                          the procedure well. The quality of the bowel                         preparation was adequate to identify polyps. Findings:      Three sessile polyps were found in the ascending colon and mid ascending       colon. The polyps were 6 mm in size. These polyps were removed with a       hot snare. Resection and retrieval were complete. To prevent bleeding       post-intervention, one hemostatic clip was successfully placed (MR       conditional). There was no bleeding at the end of the procedure.      A 25 mm polyp was found in the cecum. The polyp was sessile. The polyp       was removed with a piecemeal technique using a hot snare. Resection and       retrieval were complete. Coagulation for destruction of remaining       portion of lesion using argon beam was successful. To prevent bleeding       post-intervention, two hemostatic clips were successfully placed (MR       conditional). There was no bleeding at the end of the procedure.      The retroflexed view of the distal rectum and anal verge was normal and       showed no anal or rectal abnormalities. Impression:            - Two 6 mm polyps in the ascending colon and in the                         mid ascending colon, removed with a hot snare.                         Resected and retrieved. Clip (MR conditional) was                         placed.                        - One 25 mm polyp in the cecum, removed piecemeal                         using a hot snare. Resected and retrieved. Treated                         with argon beam coagulation. Clips (MR conditional)  were placed.                        - The distal rectum and anal verge are normal on                         retroflexion view. Recommendation:        - Telephone endoscopist for pathology results in 1                         week. Procedure Code(s):     --- Professional ---                        [phone], Colonoscopy, flexible; with  removal of                         tumor(s), polyp(s), or other lesion(s) by snare                         technique Diagnosis Code(s):     --- Professional ---                        K63.5, Polyp of colon                        Z86.010, Personal history of colonic polyps                        Z85.038, Personal history of other malignant neoplasm                         of large intestine CPT copyright 2019 American Medical Association. All rights reserved. The codes documented in this report are preliminary and upon coder review may  be revised to meet current compliance requirements. Robert Bellow, MD 12/21/2020 8:52:54 AM This report has been signed electronically. Number of Addenda: 0 Note Initiated On: 12/21/2020 7:29 AM Scope Withdrawal Time: 1 hour 2 minutes 58 seconds  Total Procedure Duration: 1 hour 10 minutes 54 seconds  Estimated Blood Loss:  Estimated blood loss: none.      [facility]

## 2020-12-21 NOTE — Anesthesia Procedure Notes (Signed)
Date/Time: 12/21/2020 7:30 AM Performed by: Johnna Acosta, CRNA Pre-anesthesia Checklist: Patient identified, Emergency Drugs available, Suction available, Patient being monitored and Timeout performed Patient Re-evaluated:Patient Re-evaluated prior to induction Oxygen Delivery Method: Nasal cannula Preoxygenation: Pre-oxygenation with 100% oxygen Induction Type: IV induction

## 2020-12-21 NOTE — Anesthesia Postprocedure Evaluation (Signed)
Anesthesia Post Note  Patient: Darin Bell  Procedure(s) Performed: COLONOSCOPY WITH PROPOFOL (N/A )  Patient location during evaluation: Endoscopy Anesthesia Type: General Level of consciousness: awake and alert and oriented Pain management: pain level controlled Vital Signs Assessment: post-procedure vital signs reviewed and stable Respiratory status: spontaneous breathing, nonlabored ventilation and respiratory function stable Cardiovascular status: blood pressure returned to baseline and stable Postop Assessment: no signs of nausea or vomiting Anesthetic complications: no   No complications documented.   Last Vitals:  Vitals:   12/21/20 0854 12/21/20 0900  BP: 132/82 139/83  Pulse: (!) 58   Resp: 11   Temp:    SpO2: 97%     Last Pain:  Vitals:   12/21/20 0910  TempSrc:   PainSc: 0-No pain                 Avis Mcmahill

## 2020-12-21 NOTE — H&P (Signed)
Darin Bell [id number] 03-Sep-1946     HPI:  75 y/o male post sigmoid resection and large right colon polyp removal in the past. For follow up exam. Tolerated the prep well.   Medications Prior to Admission  Medication Sig Dispense Refill Last Dose  . aspirin EC 81 MG tablet Take 81 mg by mouth daily. Swallow whole.   Past Week at Unknown time  . ibuprofen (ADVIL,MOTRIN) 200 MG tablet Take 1 tablet (200 mg total) by mouth every 4 (four) hours as needed for mild pain. 30 tablet 0 Past Week at Unknown time  . prednisoLONE acetate (PRED FORTE) 1 % ophthalmic suspension Place 1 drop into the left eye 2 (two) times daily.   Past Week at Unknown time   Allergies  Allergen Reactions  . Oxycodone-Acetaminophen     Constipation, Retention per patient  . Ciprofloxacin Diarrhea  . Alfuzosin     Dizziness  . Bactrim [Sulfamethoxazole-Trimethoprim] Other (See Comments)    GI distress  . Dutasteride   . Tamsulosin     Dizziness   Past Medical History:  Diagnosis Date  . Benign localized hyperplasia of prostate with urinary obstruction and other lower urinary tract symptoms (LUTS) 04/15/2013  . BPH (benign prostatic hyperplasia)   . Cancer ([place])    skin cancer  . Cancer of sigmoid colon ([place]) 12/29/2018   T3, N0, lymphovascular invasion.  Margins clear. No loss of MMR proteins.   . Enlarged prostate with lower urinary tract symptoms (LUTS) 04/15/2013  . Family history of prostate cancer 04/15/2013  . History of colon cancer   . History of degenerative disc disease 07/20/2016   Overview:  Status post C-spine surgery x 2, last in 1996  . Hyperlipidemia   . Incomplete emptying of bladder 04/15/2013  . Post-void dribbling 04/15/2013  . Pure hypercholesterolemia 09/08/2014  . Retention of urine 04/15/2013  . Squamous cell carcinoma of skin 2011   left forearm   Past Surgical History:  Procedure Laterality Date  . BACK SURGERY  1994   cervical disc  . BACK SURGERY  1996   cervical spine surgery  .  COLON SURGERY  12/16/2018   hemicolectomy  . COLONOSCOPY WITH PROPOFOL N/A 12/16/2018   Procedure: COLONOSCOPY WITH PROPOFOL;  Surgeon: Lollie Sails, MD;  Location: [facility] ENDOSCOPY;  Service: Endoscopy;  Laterality: N/A;  . COLONOSCOPY WITH PROPOFOL N/A 12/23/2019   Procedure: COLONOSCOPY WITH PROPOFOL;  Surgeon: Robert Bellow, MD;  Location: [facility] ENDOSCOPY;  Service: Endoscopy;  Laterality: N/A;  Need CEA on arrival  . EYE SURGERY Bilateral    Cataract Extraction with IOL  . HERNIA REPAIR Right 2015   Inguinal Hernia Repair  . LAPAROSCOPIC SIGMOID COLECTOMY N/A 12/29/2018   Procedure: LAPAROSCOPIC SIGMOID COLECTOMY;  Surgeon: Robert Bellow, MD;  Location: [facility] ORS;  Service: General;  Laterality: N/A;  . TRANSURETHRAL RESECTION OF PROSTATE N/A 08/13/2016   Procedure: TRANSURETHRAL RESECTION OF THE PROSTATE (TURP);  Surgeon: Hollice Espy, MD;  Location: [facility] ORS;  Service: Urology;  Laterality: N/A;   Social History   Socioeconomic History  . Marital status: Single    Spouse name: Not on file  . Number of children: 3  . Years of education: Not on file  . Highest education level: Not on file  Occupational History  . Not on file  Tobacco Use  . Smoking status: Never Smoker  . Smokeless tobacco: Never Used  Vaping Use  . Vaping Use: Never used  Substance and Sexual Activity  .  Alcohol use: Never  . Drug use: No  . Sexual activity: Not on file  Other Topics Concern  . Not on file  Social History Narrative   Darin Bell; no alcohol; no smoking. [name]; lives with wife.    Social Determinants of Health   Financial Resource Strain: Not on file  Food Insecurity: Not on file  Transportation Needs: Not on file  Physical Activity: Not on file  Stress: Not on file  Social Connections: Not on file  Intimate Partner Violence: Not on file   Social History   Social History Narrative   Darin Bell; no alcohol; no smoking. [name]; lives with wife.      ROS: Negative.      PE: HEENT: Negative. Lungs: Clear. Cardio: RR.  Assessment/Plan:  Proceed with planned endoscopy.    Forest Gleason [facility] 12/21/2020

## 2020-12-21 NOTE — Transfer of Care (Signed)
Immediate Anesthesia Transfer of Care Note  Patient: Darin Bell  Procedure(s) Performed: COLONOSCOPY WITH PROPOFOL (N/A )  Patient Location: PACU  Anesthesia Type:General  Level of Consciousness: sedated  Airway & Oxygen Therapy: Patient Spontanous Breathing  Post-op Assessment: Report given to RN and Post -op Vital signs reviewed and stable  Post vital signs: Reviewed and stable  Last Vitals:  Vitals Value Taken Time  BP 132/82 12/21/20 0854  Temp 36.3 C 12/21/20 0850  Pulse 58 12/21/20 0854  Resp 11 12/21/20 0854  SpO2 97 % 12/21/20 0854    Last Pain:  Vitals:   12/21/20 0850  TempSrc: Temporal  PainSc: Asleep         Complications: No complications documented.

## 2020-12-22 ENCOUNTER — Encounter: Payer: Self-pay | Admitting: General Surgery

## 2020-12-22 LAB — SURGICAL PATHOLOGY

## 2021-01-18 DIAGNOSIS — E7800 Pure hypercholesterolemia, unspecified: Secondary | ICD-10-CM | POA: Diagnosis not present

## 2021-01-18 DIAGNOSIS — Z85038 Personal history of other malignant neoplasm of large intestine: Secondary | ICD-10-CM | POA: Diagnosis not present

## 2021-01-18 DIAGNOSIS — Z8739 Personal history of other diseases of the musculoskeletal system and connective tissue: Secondary | ICD-10-CM | POA: Diagnosis not present

## 2021-01-25 DIAGNOSIS — Z0000 Encounter for general adult medical examination without abnormal findings: Secondary | ICD-10-CM | POA: Diagnosis not present

## 2021-01-25 DIAGNOSIS — Z8739 Personal history of other diseases of the musculoskeletal system and connective tissue: Secondary | ICD-10-CM | POA: Diagnosis not present

## 2021-01-25 DIAGNOSIS — N401 Enlarged prostate with lower urinary tract symptoms: Secondary | ICD-10-CM | POA: Diagnosis not present

## 2021-01-25 DIAGNOSIS — E7800 Pure hypercholesterolemia, unspecified: Secondary | ICD-10-CM | POA: Diagnosis not present

## 2021-01-25 DIAGNOSIS — R3911 Hesitancy of micturition: Secondary | ICD-10-CM | POA: Diagnosis not present

## 2021-02-06 ENCOUNTER — Ambulatory Visit: Payer: PPO | Admitting: Dermatology

## 2021-03-09 ENCOUNTER — Emergency Department
Admission: EM | Admit: 2021-03-09 | Discharge: 2021-03-09 | Disposition: A | Discharge status: Home / Self Care | Payer: PPO

## 2021-03-09 ENCOUNTER — Other Ambulatory Visit: Payer: Self-pay | Admitting: Otolaryngology

## 2021-03-09 DIAGNOSIS — R42 Dizziness and giddiness: Secondary | ICD-10-CM | POA: Diagnosis not present

## 2021-03-09 NOTE — ED Notes (Signed)
Patient's family decided to take patient to UC instead of the ED. Pt informed registration.

## 2021-03-10 ENCOUNTER — Encounter: Payer: Self-pay | Admitting: Emergency Medicine

## 2021-03-10 ENCOUNTER — Other Ambulatory Visit: Payer: Self-pay

## 2021-03-10 ENCOUNTER — Observation Stay
Admission: EM | Admit: 2021-03-10 | Discharge: 2021-03-11 | Disposition: A | Discharge status: Home / Self Care | Payer: PPO | Attending: Internal Medicine | Admitting: Internal Medicine

## 2021-03-10 DIAGNOSIS — I10 Essential (primary) hypertension: Secondary | ICD-10-CM | POA: Diagnosis not present

## 2021-03-10 DIAGNOSIS — R61 Generalized hyperhidrosis: Secondary | ICD-10-CM | POA: Diagnosis not present

## 2021-03-10 DIAGNOSIS — R42 Dizziness and giddiness: Secondary | ICD-10-CM | POA: Diagnosis not present

## 2021-03-10 DIAGNOSIS — Z7982 Long term (current) use of aspirin: Secondary | ICD-10-CM | POA: Diagnosis not present

## 2021-03-10 DIAGNOSIS — Z85038 Personal history of other malignant neoplasm of large intestine: Secondary | ICD-10-CM | POA: Diagnosis not present

## 2021-03-10 DIAGNOSIS — R55 Syncope and collapse: Secondary | ICD-10-CM | POA: Diagnosis not present

## 2021-03-10 DIAGNOSIS — Z20822 Contact with and (suspected) exposure to covid-19: Secondary | ICD-10-CM | POA: Insufficient documentation

## 2021-03-10 DIAGNOSIS — Z85828 Personal history of other malignant neoplasm of skin: Secondary | ICD-10-CM | POA: Diagnosis not present

## 2021-03-10 DIAGNOSIS — R531 Weakness: Secondary | ICD-10-CM | POA: Diagnosis not present

## 2021-03-10 DIAGNOSIS — R110 Nausea: Secondary | ICD-10-CM | POA: Diagnosis not present

## 2021-03-10 LAB — CBC
HCT: 40.6 % (ref 39.0–52.0)
Hemoglobin: 14.1 g/dL (ref 13.0–17.0)
MCH: 32.8 pg (ref 26.0–34.0)
MCHC: 34.7 g/dL (ref 30.0–36.0)
MCV: 94.4 fL (ref 80.0–100.0)
Platelets: 166 10*3/uL (ref 150–400)
RBC: 4.3 MIL/uL (ref 4.22–5.81)
RDW: 13.2 % (ref 11.5–15.5)
WBC: 8.3 10*3/uL (ref 4.0–10.5)
nRBC: 0 % (ref 0.0–0.2)

## 2021-03-10 LAB — BASIC METABOLIC PANEL
Anion gap: 7 (ref 5–15)
BUN: 21 mg/dL (ref 8–23)
CO2: 27 mmol/L (ref 22–32)
Calcium: 9.4 mg/dL (ref 8.9–10.3)
Chloride: 103 mmol/L (ref 98–111)
Creatinine, Ser: 0.87 mg/dL (ref 0.61–1.24)
GFR, Estimated: >60 mL/min (ref >60)
Glucose, Bld: 112 mg/dL — ABNORMAL HIGH (ref 70–99)
Potassium: 3.8 mmol/L (ref 3.5–5.1)
Sodium: 137 mmol/L (ref 135–145)

## 2021-03-10 LAB — URINALYSIS, COMPLETE (UACMP) WITH MICROSCOPIC
Bacteria, UA: NONE SEEN
Bilirubin Urine: NEGATIVE
Glucose, UA: NEGATIVE mg/dL
Hgb urine dipstick: NEGATIVE
Ketones, ur: NEGATIVE mg/dL
Leukocytes,Ua: NEGATIVE
Nitrite: NEGATIVE
Protein, ur: NEGATIVE mg/dL
Specific Gravity, Urine: 1.014 (ref 1.005–1.030)
Squamous Epithelial / HPF: NONE SEEN (ref 0–5)
pH: 7 (ref 5.0–8.0)

## 2021-03-10 NOTE — ED Triage Notes (Signed)
Pt in via EMS from a family members house. Pt with hx of syncopal episode and since having covid he has had them more frequently. Pt with no LOC, vertigo like dizziness. Pt denies CP, SOB and only complaints of being dizzy. FSBS 112, no meds. 160/83 pt is schedule for carotid US with primary MD

## 2021-03-10 NOTE — H&P (Signed)
History and Physical    Darin Bell PJK:[id number] DOB: 1946/03/06 DOA: 03/10/2021  PCP: Adin Hector, MD  Patient coming from: Home via EMS  I have personally briefly reviewed patient's old medical records in [place]  Chief Complaint: Recurrent dizziness with near syncope  HPI: Darin Bell is a 75 y.o. male with medical history significant for hyperlipidemia, BPH, stage II colon cancer s/p hemicolectomy, DDD who presents to the ED for evaluation of recurrent dizziness with near syncopal episodes.  Patient reports having COVID-19, testing positive April 18.  Since then he has had occasional nonproductive cough, usually in the morning when he wakes up and some generalized fatigue as a sequela of the COVID-19 infection.  He however has been having intermittent episodes of dizziness described as feeling off balance without room spinning sensation that can occur even at rest.  These episodes are accompanied by nausea, vomiting, and sometimes brisk bowel movements without watery diarrhea.  He breaks out in cold sweats and feels clammy during these episodes.  Sometimes these episodes last half a day.  He sometimes feels as he is going to pass out but has not had any syncope or loss of consciousness.  He says initially these were occurring once every 1-2 weeks, however have been occurring more frequently recently.  He was seen by ENT, Dr. Carloyn Manner, yesterday.  He reportedly underwent tilt table test which was negative.  ENT felt this was less likely BPPV or primary peripheral vertigo.  They were planning on further work-up with MRI brain and carotid ultrasounds.  Patient however has had for recurrent episodes this past week, therefore he was advised to come to the ED for more expedited evaluation.  Patient states that he checked his blood pressure earlier today (5/27) after another episode and saw that his blood pressure was around 170/110s which is unusual for him.  EMS were called  and he said that his blood pressure was checked in both arms and was equal and in the 140s/80s.  He does report similar episodes in the past prior to his COVID-19 infection however those were occurring once every 1-2 years.  He is not currently taking any medications other than trying over-the-counter meclizine which he is not sure has made any difference.  He otherwise denies any headaches, significant change in vision, focal weakness, numbness/tingling, chest pain, palpitations, dyspnea, abdominal pain, dysuria, decreased hearing, ringing in his ears, change in sensation.   ED Course:  Initial vitals showed BP 177/96, pulse 94, RR 18, temp 98.2 F, SPO2 97% on room air.  Labs show sodium 137, potassium 3.8, bicarb 27, BUN 21, creatinine 0.87, serum glucose 112, WBC 8.3, hemoglobin 14.1, platelets 166,000.  Urinalysis negative for UTI.  MRI brain was ordered and pending.  The hospitalist service was consulted to admit for further evaluation and management.  Review of Systems: All systems reviewed and are negative except as documented in history of present illness above.   Past Medical History:  Diagnosis Date  . Benign localized hyperplasia of prostate with urinary obstruction and other lower urinary tract symptoms (LUTS) 04/15/2013  . BPH (benign prostatic hyperplasia)   . Cancer ([place])    skin cancer  . Cancer of sigmoid colon ([place]) 12/29/2018   T3, N0, lymphovascular invasion.  Margins clear. No loss of MMR proteins.   . Enlarged prostate with lower urinary tract symptoms (LUTS) 04/15/2013  . Family history of prostate cancer 04/15/2013  . History of colon cancer   .  History of degenerative disc disease 07/20/2016   Overview:  Status post C-spine surgery x 2, last in 1996  . Hyperlipidemia   . Incomplete emptying of bladder 04/15/2013  . Post-void dribbling 04/15/2013  . Pure hypercholesterolemia 09/08/2014  . Retention of urine 04/15/2013  . Squamous cell carcinoma of skin 2011   left  forearm    Past Surgical History:  Procedure Laterality Date  . BACK SURGERY  1994   cervical disc  . BACK SURGERY  1996   cervical spine surgery  . COLON SURGERY  12/16/2018   hemicolectomy  . COLONOSCOPY WITH PROPOFOL N/A 12/16/2018   Procedure: COLONOSCOPY WITH PROPOFOL;  Surgeon: Lollie Sails, MD;  Location[facility] ENDOSCOPY;  Service: Endoscopy;  Laterality: N/A;  . COLONOSCOPY WITH PROPOFOL N/A 12/23/2019   Procedure: COLONOSCOPY WITH PROPOFOL;  Surgeon: Robert Bellow, MD;  Location: [facility] ENDOSCOPY;  Service: Endoscopy;  Laterality: N/A;  Need CEA on arrival  . COLONOSCOPY WITH PROPOFOL N/A 12/21/2020   Procedure: COLONOSCOPY WITH PROPOFOL;  Surgeon: Robert Bellow, MD;  Location: [facility] ENDOSCOPY;  Service: Endoscopy;  Laterality: N/A;  1st case  . EYE SURGERY Bilateral    Cataract Extraction with IOL  . HERNIA REPAIR Right 2015   Inguinal Hernia Repair  . LAPAROSCOPIC SIGMOID COLECTOMY N/A 12/29/2018   Procedure: LAPAROSCOPIC SIGMOID COLECTOMY;  Surgeon: Robert Bellow, MD;  Location: [facility] ORS;  Service: General;  Laterality: N/A;  . TRANSURETHRAL RESECTION OF PROSTATE N/A 08/13/2016   Procedure: TRANSURETHRAL RESECTION OF THE PROSTATE (TURP);  Surgeon: Hollice Espy, MD;  Location: [facility] ORS;  Service: Urology;  Laterality: N/A;    Social History:  reports that he has never smoked. He has never used smokeless tobacco. He reports that he does not drink alcohol and does not use drugs.  Allergies  Allergen Reactions  . Oxycodone-Acetaminophen     Constipation, Retention per patient  . Ciprofloxacin Diarrhea  . Alfuzosin     Dizziness  . Bactrim [Sulfamethoxazole-Trimethoprim] Other (See Comments)    GI distress  . Dutasteride   . Tamsulosin     Dizziness    Family History  Problem Relation Age of Onset  . Prostate cancer Father   . Cancer Father   . Diabetes Father   . Kidney cancer Neg Hx      Prior to Admission medications   Medication Sig Start  Date End Date Taking? Authorizing Provider  aspirin EC 81 MG tablet Take 81 mg by mouth daily. Swallow whole.    [provider]  ibuprofen (ADVIL,MOTRIN) 200 MG tablet Take 1 tablet (200 mg total) by mouth every 4 (four) hours as needed for mild pain. 12/31/18   Robert Bellow, MD  prednisoLONE acetate (PRED FORTE) 1 % ophthalmic suspension Place 1 drop into the left eye 2 (two) times daily. 09/05/18   [provider]    Physical Exam: Vitals:   03/10/21 1737 03/10/21 1739  BP:  (!) 177/96  Pulse:  94  Resp:  18  Temp:  98.2 F (36.8 C)  TempSrc:  Oral  SpO2:  97%  Weight: 81.6 kg   Height: $Remove'5\' 11"'zkXpOqs$  (1.803 m)    Constitutional: Resting in bed with head elevated, NAD, calm, comfortable Eyes: PERRL, EOMI, no nystagmus, lids and conjunctivae normal ENMT: Mucous membranes are moist. Posterior pharynx clear of any exudate or lesions.Normal dentition.  Neck: normal, supple, no masses.  No carotid bruit. Respiratory: clear to auscultation bilaterally, no wheezing, no crackles. Normal respiratory effort.  No accessory muscle use.  Cardiovascular: Regular rate and rhythm, no murmurs / rubs / gallops. No extremity edema. 2+ pedal pulses. Abdomen: no tenderness, no masses palpated. No hepatosplenomegaly. Bowel sounds positive.  Musculoskeletal: no clubbing / cyanosis. No joint deformity upper and lower extremities. Good ROM, no contractures. Normal muscle tone.  Skin: no rashes, lesions, ulcers. No induration Neurologic: CN 2-12 grossly intact. Sensation intact. Strength 5/5 in all 4.  No dysmetria or dysdiadochokinesia. Psychiatric: Normal judgment and insight. Alert and oriented x 3. Normal mood.   Labs on Admission: I have personally reviewed following labs and imaging studies  CBC: Recent Labs  Lab 03/10/21 1742  WBC 8.3  HGB 14.1  HCT 40.6  MCV 94.4  PLT 970   Basic Metabolic Panel: Recent Labs  Lab 03/10/21 1742  NA 137  K 3.8  CL 103  CO2 27  GLUCOSE  112*  BUN 21  CREATININE 0.87  CALCIUM 9.4   GFR: Estimated Creatinine Clearance: 80.5 mL/min (by C-G formula based on SCr of 0.87 mg/dL). Liver Function Tests: No results for input(s): AST, ALT, ALKPHOS, BILITOT, PROT, ALBUMIN in the last 168 hours. No results for input(s): LIPASE, AMYLASE in the last 168 hours. No results for input(s): AMMONIA in the last 168 hours. Coagulation Profile: No results for input(s): INR, PROTIME in the last 168 hours. Cardiac Enzymes: No results for input(s): CKTOTAL, CKMB, CKMBINDEX, TROPONINI in the last 168 hours. BNP (last 3 results) No results for input(s): PROBNP in the last 8760 hours. HbA1C: No results for input(s): HGBA1C in the last 72 hours. CBG: No results for input(s): GLUCAP in the last 168 hours. Lipid Profile: No results for input(s): CHOL, HDL, LDLCALC, TRIG, CHOLHDL, LDLDIRECT in the last 72 hours. Thyroid Function Tests: No results for input(s): TSH, T4TOTAL, FREET4, T3FREE, THYROIDAB in the last 72 hours. Anemia Panel: No results for input(s): VITAMINB12, FOLATE, FERRITIN, TIBC, IRON, RETICCTPCT in the last 72 hours. Urine analysis:    Component Value Date/Time   COLORURINE YELLOW (A) 03/10/2021 1746   APPEARANCEUR CLEAR (A) 03/10/2021 1746   APPEARANCEUR Cloudy (A) 09/27/2016 0856   LABSPEC 1.014 03/10/2021 1746   PHURINE 7.0 03/10/2021 1746   GLUCOSEU NEGATIVE 03/10/2021 1746   HGBUR NEGATIVE 03/10/2021 1746   BILIRUBINUR NEGATIVE 03/10/2021 1746   BILIRUBINUR Negative 09/27/2016 0856   KETONESUR NEGATIVE 03/10/2021 1746   PROTEINUR NEGATIVE 03/10/2021 1746   NITRITE NEGATIVE 03/10/2021 1746   LEUKOCYTESUR NEGATIVE 03/10/2021 1746    Radiological Exams on Admission: No results found.  EKG: Personally reviewed. Normal sinus rhythm without acute ischemic changes.  Assessment/Plan Active Problems:   Dizziness   Recurrent dizziness with near syncope: Unclear etiology with intermittent dizziness, nausea, vomiting,  diaphoresis, near syncopal episodes that have been occurring even at rest with increasing frequency.  Seen by ENT and had a reportedly negative tilt table test and felt this was unlikely BPPV or primary peripheral vertigo.  No neurological deficits on exam at time of admission.  Differentials include posterior circulation CVA, vestibular migraine, hypertensive episodes, and less likely orthostasis or Mnire's disease. -MRI brain without contrast -Carotid ultrasound -Orthostatic vitals -Vestibular PT -Will not pursue further CVA work-up unless MRI brain shows evidence of CVA  Hyperlipidemia: Not currently on statin.  History of colon cancer s/p hemicolectomy  DVT prophylaxis: Lovenox Code Status: Full code, confirmed with patient Family Communication: Discussed with patient's daughter at bedside Disposition Plan: From home, dispo pending further work-up Consults called: None Level of care: Med-Surg Admission  status:  Status is: Observation  The patient remains OBS appropriate and will d/c before 2 midnights.  Dispo: The patient is from: Home              Anticipated d/c is to: Home              Patient currently is not medically stable to d/c.   Difficult to place patient No  Zada Finders MD [facility]  If 7PM-7AM, please contact night-coverage [email or web address]  03/11/2021, 12:12 AM

## 2021-03-10 NOTE — ED Provider Notes (Signed)
[facility] Emergency Department Provider Note  ____________________________________________   Event Date/Time   First MD Initiated Contact with Patient 03/10/21 2147     (approximate)  I have reviewed the triage vital signs and the nursing notes.   HISTORY  Chief Complaint Loss of Consciousness    HPI Darin Bell is a 75 y.o. male here with recurrent episodes of what he initially described as vertigo, but per ENT is concerning for possible TIA.  The patient states that over the last month, he has had initially once weekly but now for in the last week episodes of recurrent dizziness and nausea with vomiting.  The patient states that he will fairly acutely feel like he is going to vomit, feels weak, and occasionally breaks out in a cold sweat.  He then feels mildly lightheaded and dizzy, like he is off balance, though the room is not spinning.  These episodes have been increasingly frequent.  He does not recall any vision changes with it.  He often vomits, and the symptoms can last up to half a day.  He was seen by ENT originally who gave him steroids and was treating him for vertigo.  However, he was reevaluated just yesterday, and per ENT they do not feel this is likely primary peripheral vertigo.  They recommended him to his PCP who is obtaining an MRI for stroke/TIA work-up.  Patient had another episode today, so he presents for evaluation.  He does not recall any specific palpitations or chest pain during these episodes.  They seem to come and go randomly.  No specific alleviating factors.        Past Medical History:  Diagnosis Date  . Benign localized hyperplasia of prostate with urinary obstruction and other lower urinary tract symptoms (LUTS) 04/15/2013  . BPH (benign prostatic hyperplasia)   . Cancer ([place])    skin cancer  . Cancer of sigmoid colon ([place]) 12/29/2018   T3, N0, lymphovascular invasion.  Margins clear. No loss of MMR proteins.   . Enlarged  prostate with lower urinary tract symptoms (LUTS) 04/15/2013  . Family history of prostate cancer 04/15/2013  . History of colon cancer   . History of degenerative disc disease 07/20/2016   Overview:  Status post C-spine surgery x 2, last in 1996  . Hyperlipidemia   . Incomplete emptying of bladder 04/15/2013  . Post-void dribbling 04/15/2013  . Pure hypercholesterolemia 09/08/2014  . Retention of urine 04/15/2013  . Squamous cell carcinoma of skin 2011   left forearm    Patient Active Problem List   Diagnosis Date Noted  . Dizziness 03/10/2021  . Colon cancer ([place]) 12/29/2018  . Malignant neoplasm of sigmoid colon ([place]) 12/24/2018  . BPH with obstruction/lower urinary tract symptoms 08/13/2016  . History of degenerative disc disease 07/20/2016  . Sun-damaged skin 07/20/2016  . Pure hypercholesterolemia 09/08/2014  . Benign localized hyperplasia of prostate with urinary obstruction and other lower urinary tract symptoms (LUTS) 04/15/2013  . Enlarged prostate with lower urinary tract symptoms (LUTS) 04/15/2013  . Family history of prostate cancer 04/15/2013  . Incomplete emptying of bladder 04/15/2013  . Post-void dribbling 04/15/2013  . Retention of urine 04/15/2013    Past Surgical History:  Procedure Laterality Date  . BACK SURGERY  1994   cervical disc  . BACK SURGERY  1996   cervical spine surgery  . COLON SURGERY  12/16/2018   hemicolectomy  . COLONOSCOPY WITH PROPOFOL N/A 12/16/2018   Procedure: COLONOSCOPY WITH  PROPOFOL;  Surgeon: Lollie Sails, MD;  Location: [facility] ENDOSCOPY;  Service: Endoscopy;  Laterality: N/A;  . COLONOSCOPY WITH PROPOFOL N/A 12/23/2019   Procedure: COLONOSCOPY WITH PROPOFOL;  Surgeon: Robert Bellow, MD;  Location: [facility] ENDOSCOPY;  Service: Endoscopy;  Laterality: N/A;  Need CEA on arrival  . COLONOSCOPY WITH PROPOFOL N/A 12/21/2020   Procedure: COLONOSCOPY WITH PROPOFOL;  Surgeon: Robert Bellow, MD;  Location: [facility] ENDOSCOPY;  Service:  Endoscopy;  Laterality: N/A;  1st case  . EYE SURGERY Bilateral    Cataract Extraction with IOL  . HERNIA REPAIR Right 2015   Inguinal Hernia Repair  . LAPAROSCOPIC SIGMOID COLECTOMY N/A 12/29/2018   Procedure: LAPAROSCOPIC SIGMOID COLECTOMY;  Surgeon: Robert Bellow, MD;  Location: [facility] ORS;  Service: General;  Laterality: N/A;  . TRANSURETHRAL RESECTION OF PROSTATE N/A 08/13/2016   Procedure: TRANSURETHRAL RESECTION OF THE PROSTATE (TURP);  Surgeon: Hollice Espy, MD;  Location: [facility] ORS;  Service: Urology;  Laterality: N/A;    Prior to Admission medications   Medication Sig Start Date End Date Taking? Authorizing Provider  aspirin EC 81 MG tablet Take 81 mg by mouth daily. Swallow whole.    [provider]  ibuprofen (ADVIL,MOTRIN) 200 MG tablet Take 1 tablet (200 mg total) by mouth every 4 (four) hours as needed for mild pain. 12/31/18   Robert Bellow, MD  prednisoLONE acetate (PRED FORTE) 1 % ophthalmic suspension Place 1 drop into the left eye 2 (two) times daily. 09/05/18   [provider]    Allergies Oxycodone-acetaminophen, Ciprofloxacin, Alfuzosin, Bactrim [sulfamethoxazole-trimethoprim], Dutasteride, and Tamsulosin  Family History  Problem Relation Age of Onset  . Prostate cancer Father   . Cancer Father   . Diabetes Father   . Kidney cancer Neg Hx     Social History Social History   Tobacco Use  . Smoking status: Never Smoker  . Smokeless tobacco: Never Used  Vaping Use  . Vaping Use: Never used  Substance Use Topics  . Alcohol use: Never  . Drug use: No    Review of Systems  Review of Systems  Constitutional: Positive for fatigue. Negative for chills and fever.  HENT: Negative for sore throat.   Respiratory: Negative for shortness of breath.   Cardiovascular: Negative for chest pain.  Gastrointestinal: Positive for nausea and vomiting. Negative for abdominal pain.  Genitourinary: Negative for flank pain.  Musculoskeletal:  Negative for neck pain.  Skin: Negative for rash and wound.  Allergic/Immunologic: Negative for immunocompromised state.  Neurological: Positive for weakness and light-headedness. Negative for numbness.  Hematological: Does not bruise/bleed easily.  All other systems reviewed and are negative.    ____________________________________________  PHYSICAL EXAM:      VITAL SIGNS: ED Triage Vitals  Enc Vitals Group     BP 03/10/21 1739 (!) 177/96     Pulse Rate 03/10/21 1739 94     Resp 03/10/21 1739 18     Temp 03/10/21 1739 98.2 F (36.8 C)     Temp Source 03/10/21 1739 Oral     SpO2 03/10/21 1739 97 %     Weight 03/10/21 1737 180 lb (81.6 kg)     Height 03/10/21 1737 5' 11" (1.803 m)     Head Circumference --      Peak Flow --      Pain Score 03/10/21 1737 0     Pain Loc --      Pain Edu? --      Excl. in [place]? --  Physical Exam Vitals and nursing note reviewed.  Constitutional:      General: He is not in acute distress.    Appearance: He is well-developed.  HENT:     Head: Normocephalic and atraumatic.  Eyes:     Conjunctiva/sclera: Conjunctivae normal.  Cardiovascular:     Rate and Rhythm: Normal rate and regular rhythm.     Heart sounds: Normal heart sounds. No murmur heard. No friction rub.  Pulmonary:     Effort: Pulmonary effort is normal. No respiratory distress.     Breath sounds: Normal breath sounds. No wheezing or rales.  Abdominal:     General: There is no distension.     Palpations: Abdomen is soft.     Tenderness: There is no abdominal tenderness.  Musculoskeletal:     Cervical back: Neck supple.  Skin:    General: Skin is warm.     Capillary Refill: Capillary refill takes less than 2 seconds.  Neurological:     Mental Status: He is alert and oriented to person, place, and time.     Motor: No abnormal muscle tone.     Comments: Neurological Exam:  Mental Status: Alert and oriented to person, place, and time. Attention and concentration normal.  Speech clear. Recent memory is intact. Cranial Nerves: Visual fields grossly intact. EOMI and PERRLA. No nystagmus noted. Facial sensation intact at forehead, maxillary cheek, and chin/mandible bilaterally. No facial asymmetry or weakness. Hearing grossly normal. Uvula is midline, and palate elevates symmetrically. Normal SCM and trapezius strength. Tongue midline without fasciculations. Motor: Muscle strength 5/5 in proximal and distal UE and LE bilaterally. No pronator drift. Muscle tone normal.  Sensation: Intact to light touch in upper and lower extremities distally bilaterally.  Gait: Normal without ataxia. Coordination: Normal FTN bilaterally.          ____________________________________________   LABS (all labs ordered are listed, but only abnormal results are displayed)  Labs Reviewed  BASIC METABOLIC PANEL - Abnormal; Notable for the following components:      Result Value   Glucose, Bld 112 (*)    All other components within normal limits  URINALYSIS, COMPLETE (UACMP) WITH MICROSCOPIC - Abnormal; Notable for the following components:   Color, Urine YELLOW (*)    APPearance CLEAR (*)    All other components within normal limits  RESP PANEL BY RT-PCR (FLU A&B, COVID) ARPGX2  CBC  PROTIME-INR  BASIC METABOLIC PANEL  CBG MONITORING, ED  TROPONIN I (HIGH SENSITIVITY)    ____________________________________________  EKG: Normal sinus rhythm, with short PR.  Ventricular rate 64.  PR 108, QRS 82, QTc 410.  No acute ST elevations or depressions. ________________________________________  RADIOLOGY All imaging, including plain films, CT scans, and ultrasounds, independently reviewed by me, and interpretations confirmed via formal radiology reads.  ED MD interpretation:   MR Pending  Official radiology report(s): No results found.  ____________________________________________  PROCEDURES   Procedure(s) performed (including Critical  Care):  Procedures  ____________________________________________  INITIAL IMPRESSION / MDM / [place] / ED COURSE  As part of my medical decision making, I reviewed the following data within the [place] notes reviewed and incorporated, Old chart reviewed, Notes from prior ED visits, and [place] Controlled Substance Database       *Darin Bell was evaluated in Emergency Department on 03/11/2021 for the symptoms described in the history of present illness. He was evaluated in the context of the global COVID-19 pandemic, which necessitated consideration that  the patient might be at risk for infection with the SARS-CoV-2 virus that causes COVID-19. Institutional protocols and algorithms that pertain to the evaluation of patients at risk for COVID-19 are in a state of rapid change based on information released by regulatory bodies including the CDC and federal and state organizations. These policies and algorithms were followed during the patient's care in the ED.  Some ED evaluations and interventions may be delayed as a result of limited staffing during the pandemic.*     Medical Decision Making:  75 yo M here with recurrent episodes of dizziness/weakness/diaphoresis. DDx includes: TIA, transient arrhythmia with hypoperfusion, vertigo episodes. No focal deficits on exam. Sx resolve completely, making CVA unlikely. Vertiginous migraine also a consideration though no headache. Labs reviewed, overall are reassuring. EKG nonischemic. No ectopy on tele. Will admit for obs, MRI.  ____________________________________________  FINAL CLINICAL IMPRESSION(S) / ED DIAGNOSES  Final diagnoses:  Dizziness     MEDICATIONS GIVEN DURING THIS VISIT:  Medications   stroke: mapping our early stages of recovery book ( Does not apply Not Given 03/11/21 0054)  acetaminophen (TYLENOL) tablet 650 mg (has no administration in time range)    Or  acetaminophen (TYLENOL) 160 MG/5ML  solution 650 mg (has no administration in time range)    Or  acetaminophen (TYLENOL) suppository 650 mg (has no administration in time range)  senna-docusate (Senokot-S) tablet 1 tablet (has no administration in time range)  enoxaparin (LOVENOX) injection 40 mg (40 mg Subcutaneous Given 03/11/21 0104)     ED Discharge Orders    None       Note:  This document was prepared using Dragon voice recognition software and may include unintentional dictation errors.   Duffy Bruce, MD 03/11/21 [phone]

## 2021-03-11 ENCOUNTER — Observation Stay: Payer: PPO

## 2021-03-11 DIAGNOSIS — R42 Dizziness and giddiness: Secondary | ICD-10-CM | POA: Diagnosis not present

## 2021-03-11 DIAGNOSIS — E785 Hyperlipidemia, unspecified: Secondary | ICD-10-CM | POA: Diagnosis not present

## 2021-03-11 DIAGNOSIS — I6782 Cerebral ischemia: Secondary | ICD-10-CM | POA: Diagnosis not present

## 2021-03-11 DIAGNOSIS — I6523 Occlusion and stenosis of bilateral carotid arteries: Secondary | ICD-10-CM | POA: Diagnosis not present

## 2021-03-11 DIAGNOSIS — R55 Syncope and collapse: Secondary | ICD-10-CM | POA: Diagnosis not present

## 2021-03-11 DIAGNOSIS — R9082 White matter disease, unspecified: Secondary | ICD-10-CM | POA: Diagnosis not present

## 2021-03-11 LAB — BASIC METABOLIC PANEL
Anion gap: 8 (ref 5–15)
BUN: 21 mg/dL (ref 8–23)
CO2: 26 mmol/L (ref 22–32)
Calcium: 8.7 mg/dL — ABNORMAL LOW (ref 8.9–10.3)
Chloride: 104 mmol/L (ref 98–111)
Creatinine, Ser: 0.81 mg/dL (ref 0.61–1.24)
GFR, Estimated: >60 mL/min (ref >60)
Glucose, Bld: 97 mg/dL (ref 70–99)
Potassium: 3.3 mmol/L — ABNORMAL LOW (ref 3.5–5.1)
Sodium: 138 mmol/L (ref 135–145)

## 2021-03-11 LAB — RESP PANEL BY RT-PCR (FLU A&B, COVID) ARPGX2
Influenza A by PCR: NEGATIVE
Influenza B by PCR: NEGATIVE
SARS Coronavirus 2 by RT PCR: NEGATIVE

## 2021-03-11 LAB — PROTIME-INR
INR: 1.1 (ref 0.8–1.2)
Prothrombin Time: 13.8 seconds (ref 11.4–15.2)

## 2021-03-11 LAB — TROPONIN I (HIGH SENSITIVITY): Troponin I (High Sensitivity): 5 ng/L (ref <18)

## 2021-03-11 MED ORDER — STROKE: EARLY STAGES OF RECOVERY BOOK: Freq: Once | Status: AC

## 2021-03-11 MED ORDER — ACETAMINOPHEN 325 MG PO TABS: 650.0000 mg | ORAL_TABLET | ORAL | Status: DC | PRN

## 2021-03-11 MED ORDER — ACETAMINOPHEN 650 MG RE SUPP: 650.0000 mg | RECTAL | Status: DC | PRN

## 2021-03-11 MED ORDER — SENNOSIDES-DOCUSATE SODIUM 8.6-50 MG PO TABS: 1.0000 | ORAL_TABLET | Freq: Every evening | ORAL | Status: DC | PRN

## 2021-03-11 MED ORDER — ENOXAPARIN SODIUM 40 MG/0.4ML IJ SOSY
40.0000 mg | PREFILLED_SYRINGE | INTRAMUSCULAR | Status: DC
  Administered 2021-03-11: 40 mg via SUBCUTANEOUS
  Filled 2021-03-11: qty 0.4

## 2021-03-11 MED ORDER — ACETAMINOPHEN 160 MG/5ML PO SOLN
650.0000 mg | ORAL | Status: DC | PRN
  Filled 2021-03-11: qty 20.3

## 2021-03-11 NOTE — ED Notes (Signed)
Pt in MRI. Family member in room.

## 2021-03-11 NOTE — Discharge Summary (Addendum)
Physician Discharge Summary   Darin Bell LNL:[id number] DOB: 1946/04/04 DOA: 03/10/2021  PCP: Adin Hector, MD  Admit date: 03/10/2021 Discharge date:  03/11/2021  Admitted From: Home Disposition:  Home Discharging physician: Dwyane Dee, MD  Recommendations for Outpatient Follow-up:  1. Outpatient referral to neurology made prior to discharge   Patient discharged to Home in Discharge Condition: stable Risk of unplanned readmission score:    CODE STATUS: Full  Diet recommendation:  Diet Orders (From admission, onward)    Start     Ordered   03/11/21 0000  Diet general        03/11/21 [place] [facility] Course: Darin Bell is a 75 y.o. male with medical history significant for hyperlipidemia, BPH, stage II colon cancer s/p hemicolectomy, DDD who presented to the ED for evaluation of recurrent dizziness with near syncopal episodes.  Patient reports that his dizziness episodes have been longstanding for several years but very intermittent (1 or 2 times a year) but these episodes increased in frequency after having COVID-19, testing positive April 18.  Since then he has had occasional nonproductive cough, usually in the morning when he wakes up and some generalized fatigue as a sequela of the COVID-19 infection.  He however has been having intermittent episodes of dizziness described as feeling off balance without room spinning sensation that can occur even at rest. These episodes are accompanied by nausea, vomiting at times and sometimes brisk bowel movements without watery diarrhea.   He breaks out in cold sweats and feels clammy during these episodes.   Sometimes these episodes last half a day.  He sometimes feels as he is going to pass out but has not had any syncope or loss of consciousness.   He was seen by ENT, Dr. Carloyn Manner, yesterday.  He reportedly underwent tilt table test which was negative.  ENT felt this was less likely BPPV or primary peripheral  vertigo.  They were planning on further work-up with MRI brain and carotid ultrasounds.  Due to recurrent episodes this past week, he was advised to come to the ED for more expedited evaluation.  His blood pressure has been checked during these episodes and he has not been found to be hypotensive.  He otherwise denies any headaches, significant change in vision, focal weakness, numbness/tingling, chest pain, palpitations, dyspnea, abdominal pain, dysuria, decreased hearing, ringing in his ears, change in sensation.  On admission, he underwent MRI brain which showed no acute abnormalities.  Mostly mild chronic microvascular ischemic changes consistent with patient's age.  Carotid ultrasound was also unremarkable with no significant stenosis in either ICA (estimated 0 to 49% stenosis bilaterally).  Lab work was also rather unremarkable with no significant abnormalities seen on chemistries and CBC also normal. He was evaluated by vestibular PT and again did not have any reproducible dizziness.  There was also no nystagmus noted nor evidence of a positive Dix-Hallpike maneuver.  Because of lack of etiology to explain his symptoms Case was discussed with neurology.  Decision was made to have outpatient referral to neurology at time of discharge.  Patient was amenable with this plan also.   The patient's chronic medical conditions were treated accordingly per the patient's home medication regimen except as noted.  On day of discharge, patient was felt deemed stable for discharge. Patient/family member advised to call PCP or come back to ER if needed.   Principal Diagnosis: Dizziness  Discharge Diagnoses: [facility]  Problems   Diagnosis Date Noted  . Dizziness 03/10/2021    Priority: [facility] Problems  No resolved problems to display.    Discharge Instructions    Ambulatory referral to Neurology   Complete by: As directed    Diet general   Complete by: As directed     Increase activity slowly   Complete by: As directed      Allergies as of 03/11/2021      Reactions   Oxycodone-acetaminophen    Constipation, Retention per patient   Ciprofloxacin Diarrhea   Alfuzosin    Dizziness   Bactrim [sulfamethoxazole-trimethoprim] Other (See Comments)   GI distress   Dutasteride    Tamsulosin    Dizziness      Medication List    STOP taking these medications   aspirin EC 81 MG tablet   diazepam 5 MG tablet Commonly known as: VALIUM   prednisoLONE acetate 1 % ophthalmic suspension Commonly known as: PRED FORTE   predniSONE 10 MG (21) Tbpk tablet Commonly known as: STERAPRED UNI-PAK 21 TAB     TAKE these medications   ibuprofen 200 MG tablet Commonly known as: ADVIL Take 1 tablet (200 mg total) by mouth every 4 (four) hours as needed for mild pain.       Follow-up Information    Vaught, Creighton, MD. Schedule an appointment as soon as possible for a visit in 1 week.   Specialty: Otolaryngology Why: Patient to make own follow up appt Contact information: [place] [phone]              Allergies  Allergen Reactions  . Oxycodone-Acetaminophen     Constipation, Retention per patient  . Ciprofloxacin Diarrhea  . Alfuzosin     Dizziness  . Bactrim [Sulfamethoxazole-Trimethoprim] Other (See Comments)    GI distress  . Dutasteride   . Tamsulosin     Dizziness    Consultations: Neuro  Discharge Exam: BP 135/85 (BP Location: Left Arm)   Pulse 64   Temp 97.8 F (36.6 C) (Oral)   Resp 17   Ht 5\' 11"  (1.803 m)   Wt 81.6 kg   SpO2 95%   BMI 25.10 kg/m  General appearance: alert, cooperative and no distress Head: Normocephalic, without obvious abnormality, atraumatic Eyes: EOMI, no nystagmus with visual testing Lungs: clear to auscultation bilaterally Heart: regular rate and rhythm and S1, S2 normal Abdomen: normal findings: bowel sounds normal and soft,  non-tender Extremities: No edema Skin: mobility and turgor normal Neurologic: Grossly normal  The results of significant diagnostics from this hospitalization (including imaging, microbiology, ancillary and laboratory) are listed below for reference.   Microbiology: Recent Results (from the past 240 hour(s))  Resp Panel by RT-PCR (Flu A&B, Covid) Nasopharyngeal Swab     Status: None   Collection Time: 03/11/21 12:00 AM   Specimen: Nasopharyngeal Swab; Nasopharyngeal(NP) swabs in vial transport medium  Result Value Ref Range Status   SARS Coronavirus 2 by RT PCR NEGATIVE NEGATIVE Final    Comment: (NOTE) SARS-CoV-2 target nucleic acids are NOT DETECTED.  The SARS-CoV-2 RNA is generally detectable in upper respiratory specimens during the acute phase of infection. The lowest concentration of SARS-CoV-2 viral copies this assay can detect is 138 copies/mL. A negative result does not preclude SARS-Cov-2 infection and should not be used as the sole basis for treatment or other patient management decisions. A negative result may occur with  improper specimen collection/handling,  submission of specimen other than nasopharyngeal swab, presence of viral mutation(s) within the areas targeted by this assay, and inadequate number of viral copies(<138 copies/mL). A negative result must be combined with clinical observations, patient history, and epidemiological information. The expected result is Negative.  Fact Sheet for Patients:  EntrepreneurPulse.com.au  Fact Sheet for Healthcare Providers:  IncredibleEmployment.be  This test is no t yet approved or cleared by the [place] FDA and  has been authorized for detection and/or diagnosis of SARS-CoV-2 by FDA under an Emergency Use Authorization (EUA). This EUA will remain  in effect (meaning this test can be used) for the duration of the COVID-19 declaration under Section 564(b)(1) of the Act,  21 U.S.C.section 360bbb-3(b)(1), unless the authorization is terminated  or revoked sooner.       Influenza A by PCR NEGATIVE NEGATIVE Final   Influenza B by PCR NEGATIVE NEGATIVE Final    Comment: (NOTE) The Xpert Xpress SARS-CoV-2/FLU/RSV plus assay is intended as an aid in the diagnosis of influenza from Nasopharyngeal swab specimens and should not be used as a sole basis for treatment. Nasal washings and aspirates are unacceptable for Xpert Xpress SARS-CoV-2/FLU/RSV testing.  Fact Sheet for Patients: EntrepreneurPulse.com.au  Fact Sheet for Healthcare Providers: IncredibleEmployment.be  This test is not yet approved or cleared by the [place] FDA and has been authorized for detection and/or diagnosis of SARS-CoV-2 by FDA under an Emergency Use Authorization (EUA). This EUA will remain in effect (meaning this test can be used) for the duration of the COVID-19 declaration under Section 564(b)(1) of the Act, 21 U.S.C. section 360bbb-3(b)(1), unless the authorization is terminated or revoked.  Performed at [facility], [address] [place], [place] [address]      Labs: BNP (last 3 results) No results for input(s): BNP in the last 8760 hours. Basic Metabolic Panel: Recent Labs  Lab 03/10/21 1742 03/11/21 0521  NA 137 138  K 3.8 3.3*  CL 103 104  CO2 27 26  GLUCOSE 112* 97  BUN 21 21  CREATININE 0.87 0.81  CALCIUM 9.4 8.7*   Liver Function Tests: No results for input(s): AST, ALT, ALKPHOS, BILITOT, PROT, ALBUMIN in the last 168 hours. No results for input(s): LIPASE, AMYLASE in the last 168 hours. No results for input(s): AMMONIA in the last 168 hours. CBC: Recent Labs  Lab 03/10/21 1742  WBC 8.3  HGB 14.1  HCT 40.6  MCV 94.4  PLT 166   Cardiac Enzymes: No results for input(s): CKTOTAL, CKMB, CKMBINDEX, TROPONINI in the last 168 hours. BNP: Invalid input(s): POCBNP CBG: No results for input(s):  GLUCAP in the last 168 hours. D-Dimer No results for input(s): DDIMER in the last 72 hours. Hgb A1c No results for input(s): HGBA1C in the last 72 hours. Lipid Profile No results for input(s): CHOL, HDL, LDLCALC, TRIG, CHOLHDL, LDLDIRECT in the last 72 hours. Thyroid function studies No results for input(s): TSH, T4TOTAL, T3FREE, THYROIDAB in the last 72 hours.  Invalid input(s): FREET3 Anemia work up No results for input(s): VITAMINB12, FOLATE, FERRITIN, TIBC, IRON, RETICCTPCT in the last 72 hours. Urinalysis    Component Value Date/Time   COLORURINE YELLOW (A) 03/10/2021 1746   APPEARANCEUR CLEAR (A) 03/10/2021 1746   APPEARANCEUR Cloudy (A) 09/27/2016 0856   LABSPEC 1.014 03/10/2021 1746   PHURINE 7.0 03/10/2021 1746   GLUCOSEU NEGATIVE 03/10/2021 1746   HGBUR NEGATIVE 03/10/2021 1746   BILIRUBINUR NEGATIVE 03/10/2021 1746   BILIRUBINUR Negative 09/27/2016 0856   KETONESUR NEGATIVE  03/10/2021 [place] 03/10/2021 1746   NITRITE NEGATIVE 03/10/2021 1746   LEUKOCYTESUR NEGATIVE 03/10/2021 1746   Sepsis Labs Invalid input(s): PROCALCITONIN,  WBC,  LACTICIDVEN Microbiology Recent Results (from the past 240 hour(s))  Resp Panel by RT-PCR (Flu A&B, Covid) Nasopharyngeal Swab     Status: None   Collection Time: 03/11/21 12:00 AM   Specimen: Nasopharyngeal Swab; Nasopharyngeal(NP) swabs in vial transport medium  Result Value Ref Range Status   SARS Coronavirus 2 by RT PCR NEGATIVE NEGATIVE Final    Comment: (NOTE) SARS-CoV-2 target nucleic acids are NOT DETECTED.  The SARS-CoV-2 RNA is generally detectable in upper respiratory specimens during the acute phase of infection. The lowest concentration of SARS-CoV-2 viral copies this assay can detect is 138 copies/mL. A negative result does not preclude SARS-Cov-2 infection and should not be used as the sole basis for treatment or other patient management decisions. A negative result may occur with  improper  specimen collection/handling, submission of specimen other than nasopharyngeal swab, presence of viral mutation(s) within the areas targeted by this assay, and inadequate number of viral copies(<138 copies/mL). A negative result must be combined with clinical observations, patient history, and epidemiological information. The expected result is Negative.  Fact Sheet for Patients:  EntrepreneurPulse.com.au  Fact Sheet for Healthcare Providers:  IncredibleEmployment.be  This test is no t yet approved or cleared by the [place] FDA and  has been authorized for detection and/or diagnosis of SARS-CoV-2 by FDA under an Emergency Use Authorization (EUA). This EUA will remain  in effect (meaning this test can be used) for the duration of the COVID-19 declaration under Section 564(b)(1) of the Act, 21 U.S.C.section 360bbb-3(b)(1), unless the authorization is terminated  or revoked sooner.       Influenza A by PCR NEGATIVE NEGATIVE Final   Influenza B by PCR NEGATIVE NEGATIVE Final    Comment: (NOTE) The Xpert Xpress SARS-CoV-2/FLU/RSV plus assay is intended as an aid in the diagnosis of influenza from Nasopharyngeal swab specimens and should not be used as a sole basis for treatment. Nasal washings and aspirates are unacceptable for Xpert Xpress SARS-CoV-2/FLU/RSV testing.  Fact Sheet for Patients: EntrepreneurPulse.com.au  Fact Sheet for Healthcare Providers: IncredibleEmployment.be  This test is not yet approved or cleared by the [place] FDA and has been authorized for detection and/or diagnosis of SARS-CoV-2 by FDA under an Emergency Use Authorization (EUA). This EUA will remain in effect (meaning this test can be used) for the duration of the COVID-19 declaration under Section 564(b)(1) of the Act, 21 U.S.C. section 360bbb-3(b)(1), unless the authorization is terminated or revoked.  Performed at  [facility], [address] [place], [place] [address]     Procedures/Studies: MR BRAIN WO CONTRAST  Result Date: 03/11/2021 CLINICAL DATA:  Initial evaluation for acute TIA. EXAM: MRI HEAD WITHOUT CONTRAST TECHNIQUE: Multiplanar, multiecho pulse sequences of the brain and surrounding structures were obtained without intravenous contrast. COMPARISON:  None available. FINDINGS: Brain: Cerebral volume within normal limits for age. Mild T2/FLAIR hyperintensity seen involving the periventricular and deep white matter both cerebral hemispheres, most like related chronic microvascular ischemic disease, mild for age. No abnormal foci of restricted diffusion to suggest acute or subacute ischemia. Gray-white matter differentiation maintained. No encephalomalacia to suggest chronic cortical infarction. No evidence for acute or chronic intracranial hemorrhage. No mass lesion, midline shift or mass effect. No hydrocephalus or extra-axial fluid collection. Pituitary gland suprasellar region within normal limits. Midline structures intact. Vascular: Major intracranial  vascular flow voids are maintained. Skull and upper cervical spine: Craniocervical junction within normal limits. Bone marrow signal intensity normal. No scalp soft tissue abnormality. Sinuses/Orbits: Patient status post bilateral ocular lens replacement. Globes and orbital soft tissues demonstrate no acute finding. Paranasal sinuses are clear. No mastoid effusion. Other: None. IMPRESSION: 1. No acute intracranial abnormality. 2. Mild chronic microvascular ischemic disease for age. Electronically Signed   By: Jeannine Boga M.D.   On: 03/11/2021 02:10   US Carotid Bilateral (at [facility] and [facility] only)  Result Date: 03/11/2021 CLINICAL DATA:  Dizziness, TIA symptoms, hyperlipidemia, syncope EXAM: BILATERAL CAROTID DUPLEX ULTRASOUND TECHNIQUE: Pearline Cables scale imaging, color Doppler and duplex ultrasound were performed of bilateral carotid and  vertebral arteries in the neck. COMPARISON:  03/11/2021 FINDINGS: Criteria: Quantification of carotid stenosis is based on velocity parameters that correlate the residual internal carotid diameter with NASCET-based stenosis levels, using the diameter of the distal internal carotid lumen as the denominator for stenosis measurement. The following velocity measurements were obtained: RIGHT ICA: 88/25 cm/sec CCA: 64/33 cm/sec SYSTOLIC ICA/CCA RATIO:  1.3 ECA: 132 cm/sec LEFT ICA: 94/31 cm/sec CCA: 29/51 cm/sec SYSTOLIC ICA/CCA RATIO:  1.1 ECA: 113 cm/sec RIGHT CAROTID ARTERY: No significant atheromatous plaque. RIGHT VERTEBRAL ARTERY:  Antegrade LEFT CAROTID ARTERY:  No significant atheromatous plaque. LEFT VERTEBRAL ARTERY:  Antegrade IMPRESSION: 1. No evidence of significant stenosis within either internal carotid artery. Estimated 0-49% stenosis bilaterally. Electronically Signed   By: Randa Ngo M.D.   On: 03/11/2021 02:34     Time coordinating discharge: Over 30 minutes    Dwyane Dee, MD  [facility] 03/12/2021, 3:27 PM

## 2021-03-11 NOTE — Plan of Care (Signed)

## 2021-03-11 NOTE — Discharge Instructions (Signed)
Neurology will contact you for an outpatient referral appointment. If this is not felt to be needed after seeing ENT again, feel free to cancel the appointment/referral.

## 2021-03-11 NOTE — Evaluation (Signed)
Physical Therapy Evaluation Patient Details Name: Darin Bell MRN: [id number] DOB: 1945/11/10 Today's Date: 03/11/2021   History of Present Illness  Darin Bell is a 75 y.o. male with medical history significant for hyperlipidemia, BPH, stage II colon cancer s/p hemicolectomy, DDD who presents to the ED for evaluation of recurrent dizziness with near syncopal episodes.  Clinical Impression  The pt participates in vestibular evaluation today d/t complaints of recurrent dizziness with unknown onset. The pt presents with no observed nystagmus, no abnormalities during smooth pursuits testing, no observed saccades, and no signs of skew deviation. He presents with limited convergence bilaterally. The pt does not demonstrate impaired gait performance during gait with head turns and has normal oculomotor performance. During today's evaluation the pt's s/s of dizziness were unable to be reproduced. At this time he reports no dizziness or pain. The pt is safe to d/c home with family care once medically stable. No further PT needs noted, if status changes please re-consult PT.     Follow Up Recommendations No PT follow up    Equipment Recommendations       Recommendations for Other Services       Precautions / Restrictions Precautions Precautions: Fall Precaution Comments: fall precautionsn d/t near syncopal episodes of dizziness of unknown onset.      Mobility  Bed Mobility Overal bed mobility: Independent                  Transfers Overall transfer level: Independent                  Ambulation/Gait Ambulation/Gait assistance: Independent   Assistive device: None       General Gait Details: Pt completing gait with head turns (horizontal and vertical) with only minor change in gait speed, no unsteadiness noted or c/o dizziness.  Stairs            Wheelchair Mobility    Modified Rankin (Stroke Patients Only)       Balance Overall balance assessment:  Independent                                           Pertinent Vitals/Pain Pain Assessment: No/denies pain    Home Living Family/patient expects to be discharged to:: Private residence Living Arrangements: Spouse/significant other Available Help at Discharge: Family Type of Home: House Home Access: Ramped entrance     [place]: Two level;Bed/bath upstairs Home Equipment: Other (comment) Additional Comments: chair lift available(used by wife)    Prior Function Level of Independence: Independent               Hand Dominance   Dominant Hand: Right    Extremity/Trunk Assessment   Upper Extremity Assessment Upper Extremity Assessment: Overall WFL for tasks assessed    Lower Extremity Assessment Lower Extremity Assessment: Overall WFL for tasks assessed    Cervical / Trunk Assessment Cervical / Trunk Assessment: Normal  Communication   Communication: No difficulties  Cognition Arousal/Alertness: Awake/alert Behavior During Therapy: WFL for tasks assessed/performed Overall Cognitive Status: Within Functional Limits for tasks assessed                                        General Comments      Exercises     Assessment/Plan  PT Assessment Patent does not need any further PT services  PT Problem List         PT Treatment Interventions      PT Goals (Current goals can be found in the Care Plan section)  Acute Rehab PT Goals Patient Stated Goal: to return home PT Goal Formulation: With patient Time For Goal Achievement: 03/25/21 Potential to Achieve Goals: Good    Frequency     Barriers to discharge        Co-evaluation               AM-PAC PT "6 Clicks" Mobility  Outcome Measure Help needed turning from your back to your side while in a flat bed without using bedrails?: None Help needed moving from lying on your back to sitting on the side of a flat bed without using bedrails?: None Help needed  moving to and from a bed to a chair (including a wheelchair)?: None Help needed standing up from a chair using your arms (e.g., wheelchair or bedside chair)?: None Help needed to walk in hospital room?: None Help needed climbing 3-5 steps with a railing? : None 6 Click Score: 24    End of Session   Activity Tolerance: Patient tolerated treatment well Patient left: in bed Nurse Communication: Mobility status PT Visit Diagnosis: Dizziness and giddiness (R42)    Time: 7017-7939 PT Time Calculation (min) (ACUTE ONLY): 16 min   Charges:   PT Evaluation $PT Eval Moderate Complexity: 1 Mod          10:14 AM, 03/11/21 Nayeli Calvert A. Saverio Danker PT, DPT Physical Therapist - [facility] A Merci Walthers 03/11/2021, 10:07 AM

## 2021-03-11 NOTE — Progress Notes (Addendum)
Pt is discharged to home. DC instructions given with daughter at bedside. No concerns voiced. Pt to f/u with neurologist outpatient. MD's number and contact info confirmed on AVS. Left unit stable and in wheelchair pushed by hospital staff.  VWilliams,RN.

## 2021-03-14 ENCOUNTER — Ambulatory Visit: Payer: PPO | Admitting: Dermatology

## 2021-03-15 DIAGNOSIS — R42 Dizziness and giddiness: Secondary | ICD-10-CM | POA: Diagnosis not present

## 2021-03-15 DIAGNOSIS — R110 Nausea: Secondary | ICD-10-CM | POA: Diagnosis not present

## 2021-03-17 DIAGNOSIS — R42 Dizziness and giddiness: Secondary | ICD-10-CM | POA: Diagnosis not present

## 2021-03-24 ENCOUNTER — Ambulatory Visit: Payer: PPO

## 2021-05-01 DIAGNOSIS — Z7689 Persons encountering health services in other specified circumstances: Secondary | ICD-10-CM | POA: Diagnosis not present

## 2021-05-01 DIAGNOSIS — H8101 Meniere's disease, right ear: Secondary | ICD-10-CM | POA: Diagnosis not present

## 2021-05-01 DIAGNOSIS — R42 Dizziness and giddiness: Secondary | ICD-10-CM | POA: Diagnosis not present

## 2021-06-27 DIAGNOSIS — Z86010 Personal history of colonic polyps: Secondary | ICD-10-CM | POA: Diagnosis not present

## 2021-06-27 DIAGNOSIS — Z85038 Personal history of other malignant neoplasm of large intestine: Secondary | ICD-10-CM | POA: Diagnosis not present

## 2021-07-17 ENCOUNTER — Ambulatory Visit: Payer: PPO | Admitting: Dermatology

## 2021-07-17 DIAGNOSIS — Z8616 Personal history of COVID-19: Secondary | ICD-10-CM | POA: Diagnosis not present

## 2021-07-17 DIAGNOSIS — H8102 Meniere's disease, left ear: Secondary | ICD-10-CM | POA: Diagnosis not present

## 2021-07-17 DIAGNOSIS — H9102 Ototoxic hearing loss, left ear: Secondary | ICD-10-CM | POA: Diagnosis not present

## 2021-07-17 DIAGNOSIS — H9312 Tinnitus, left ear: Secondary | ICD-10-CM | POA: Diagnosis not present

## 2021-07-19 ENCOUNTER — Other Ambulatory Visit: Payer: Self-pay

## 2021-07-19 ENCOUNTER — Ambulatory Visit: Payer: PPO | Admitting: Dermatology

## 2021-07-19 DIAGNOSIS — D2239 Melanocytic nevi of other parts of face: Secondary | ICD-10-CM | POA: Diagnosis not present

## 2021-07-19 DIAGNOSIS — L578 Other skin changes due to chronic exposure to nonionizing radiation: Secondary | ICD-10-CM

## 2021-07-19 DIAGNOSIS — L820 Inflamed seborrheic keratosis: Secondary | ICD-10-CM

## 2021-07-19 DIAGNOSIS — H61001 Unspecified perichondritis of right external ear: Secondary | ICD-10-CM | POA: Diagnosis not present

## 2021-07-19 DIAGNOSIS — D229 Melanocytic nevi, unspecified: Secondary | ICD-10-CM

## 2021-07-19 DIAGNOSIS — L821 Other seborrheic keratosis: Secondary | ICD-10-CM

## 2021-07-19 DIAGNOSIS — L814 Other melanin hyperpigmentation: Secondary | ICD-10-CM | POA: Diagnosis not present

## 2021-07-19 DIAGNOSIS — H8109 Meniere's disease, unspecified ear: Secondary | ICD-10-CM

## 2021-07-19 DIAGNOSIS — L570 Actinic keratosis: Secondary | ICD-10-CM

## 2021-07-19 DIAGNOSIS — Z85828 Personal history of other malignant neoplasm of skin: Secondary | ICD-10-CM

## 2021-07-19 NOTE — Progress Notes (Signed)
Follow-Up Visit   Subjective  Darin Bell is a 75 y.o. male who presents for the following: Follow-up (Patient has a history of AKs and hx of SCC of the left forearm. He has spots on the scalp and bilateral dorsum hands that were not treated at last visit. ).  He has been diagnosed with Meniere's disease since his last visit, which came up after Covid infection last April.   The following portions of the chart were reviewed this encounter and updated as appropriate:       Review of Systems:  No other skin or systemic complaints except as noted in HPI or Assessment and Plan.  Objective  Well appearing patient in no apparent distress; mood and affect are within normal limits.  A focused examination was performed including face, arms, scalp. Relevant physical exam findings are noted in the Assessment and Plan.  L hand dorsum x 4, L forearm x 4, R hand dorsum x 3, R forearm x 2 (13) Pink scaly papules.  Frontal scalp Erythematous keratotic or waxy stuck-on papule    Left malar cheek 4.75mm flesh papule  Right Ear 3.62mm firm flesh white papule  L ear helix x 1 Pink scaly macule.   Assessment & Plan  Actinic Damage - chronic, secondary to cumulative UV radiation exposure/sun exposure over time - diffuse scaly erythematous macules with underlying dyspigmentation - Recommend daily broad spectrum sunscreen SPF 30+ to sun-exposed areas, reapply every 2 hours as needed.  - Recommend staying in the shade or wearing long sleeves, sun glasses (UVA+UVB protection) and wide brim hats (4-inch brim around the entire circumference of the hat). - Call for new or changing lesions.  Seborrheic Keratoses - Stuck-on, waxy, tan-brown papules and/or plaques  - Benign-appearing - Discussed benign etiology and prognosis. - Observe - Call for any changes  Lentigines - Scattered tan macules - Due to sun exposure - Benign-appering, observe - Recommend daily broad spectrum sunscreen SPF 30+ to  sun-exposed areas, reapply every 2 hours as needed. - Call for any changes  Hypertrophic actinic keratosis (13) L hand dorsum x 4, L forearm x 4, R hand dorsum x 3, R forearm x 2  Actinic keratoses are precancerous spots that appear secondary to cumulative UV radiation exposure/sun exposure over time. They are chronic with expected duration over 1 year. A portion of actinic keratoses will progress to squamous cell carcinoma of the skin. It is not possible to reliably predict which spots will progress to skin cancer and so treatment is recommended to prevent development of skin cancer.  Recommend daily broad spectrum sunscreen SPF 30+ to sun-exposed areas, reapply every 2 hours as needed.  Recommend staying in the shade or wearing long sleeves, sun glasses (UVA+UVB protection) and wide brim hats (4-inch brim around the entire circumference of the hat). Call for new or changing lesions.  Destruction of lesion - L hand dorsum x 4, L forearm x 4, R hand dorsum x 3, R forearm x 2  Destruction method: cryotherapy   Informed consent: discussed and consent obtained   Lesion destroyed using liquid nitrogen: Yes   Region frozen until ice ball extended beyond lesion: Yes   Outcome: patient tolerated procedure well with no complications   Post-procedure details: wound care instructions given   Additional details:  Prior to procedure, discussed risks of blister formation, small wound, skin dyspigmentation, or rare scar following cryotherapy. Recommend Vaseline ointment to treated areas while healing.   Inflamed seborrheic keratosis Frontal scalp  Destruction of lesion - Frontal scalp  Destruction method: cryotherapy   Informed consent: discussed and consent obtained   Lesion destroyed using liquid nitrogen: Yes   Region frozen until ice ball extended beyond lesion: Yes   Outcome: patient tolerated procedure well with no complications   Post-procedure details: wound care instructions given    Additional details:  Prior to procedure, discussed risks of blister formation, small wound, skin dyspigmentation, or rare scar following cryotherapy. Recommend Vaseline ointment to treated areas while healing.   Nevus Left malar cheek  Benign-appearing.  Observation.  Call clinic for new or changing moles.  Recommend daily use of broad spectrum spf 30+ sunscreen to sun-exposed areas.   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  AK (actinic keratosis) L ear helix x 1  Actinic keratoses are precancerous spots that appear secondary to cumulative UV radiation exposure/sun exposure over time. They are chronic with expected duration over 1 year. A portion of actinic keratoses will progress to squamous cell carcinoma of the skin. It is not possible to reliably predict which spots will progress to skin cancer and so treatment is recommended to prevent development of skin cancer.  Recommend daily broad spectrum sunscreen SPF 30+ to sun-exposed areas, reapply every 2 hours as needed.  Recommend staying in the shade or wearing long sleeves, sun glasses (UVA+UVB protection) and wide brim hats (4-inch brim around the entire circumference of the hat). Call for new or changing lesions.  Destruction of lesion - L ear helix x 1  Destruction method: cryotherapy   Informed consent: discussed and consent obtained   Lesion destroyed using liquid nitrogen: Yes   Region frozen until ice ball extended beyond lesion: Yes   Outcome: patient tolerated procedure well with no complications   Post-procedure details: wound care instructions given   Additional details:  Prior to procedure, discussed risks of blister formation, small wound, skin dyspigmentation, or rare scar following cryotherapy. Recommend Vaseline ointment to treated areas while healing.  History of Squamous Cell Carcinoma of the Skin - No evidence of recurrence today of the left forearm -  Recommend regular full body skin exams - Recommend daily broad spectrum sunscreen SPF 30+ to sun-exposed areas, reapply every 2 hours as needed.  - Call if any new or changing lesions are noted between office visits    Return in about 6 months (around 01/17/2022) for UBSE.  IJamesetta Orleans, CMA, am acting as scribe for Brendolyn Patty, MD . Documentation: I have reviewed the above documentation for accuracy and completeness, and I agree with the above.  Brendolyn Patty MD

## 2021-07-19 NOTE — Patient Instructions (Signed)

## 2021-07-20 ENCOUNTER — Other Ambulatory Visit: Payer: Self-pay | Admitting: General Surgery

## 2021-07-20 NOTE — Progress Notes (Signed)
Subjective:     Patient ID: Darin Bell is a 75 y.o. male.   HPI   The following portions of the patient's history were reviewed and updated as appropriate.   This an established patient is here today for: office visit. He is here for pre colonoscopy exam, history colon cancer in 2020. Colonoscopy last completed 12-21-2020.   Bowels move daily, no bleeding. He states he is seeing Dr C Vaught for vertigo post COVID. Last episode was this Saturday. This developed out of the blue, the most recent episode occurring while he was driving.  He described the vehicle with the sense of it spinning around.   At the time of his hospital admission on Mar 11, 2021 he had reported intermittent episodes of dizziness, occurring once or twice a year, but increasing in frequency since his April 18 diagnosis of COVID-19. Presently pending evaluation at [facility].  Work-up to date including MRI of the brain and carotid ultrasounds have been negative.  These were completed in May 2022.   Review of Systems  Constitutional: Negative for chills and fever.  Respiratory: Negative for cough.   Gastrointestinal: Negative for anal bleeding, constipation and diarrhea.         Chief Complaint  Patient presents with   Pre-op Exam      BP 130/80   Pulse 78   Temp 36.8 C (98.2 F)   Ht 180.3 cm (5' 11")   Wt 82.6 kg (182 lb)   SpO2 97%   BMI 25.38 kg/m        Past Medical History:  Diagnosis Date   BPH (benign prostatic hypertrophy)     History of colon cancer 2020    5.1 cm moderately differentiated adenocarcinoma the sigmoid colon.  0/16 nodes.pT3,N0.  Status post hemicolectomy; evaluated by oncology, with no adjuvant therapy required   History of degenerative disc disease      Status post C-spine surgery x 2, last in 1996   Hyperlipidemia     Squamous cell cancer of skin of left forearm 2011   Sun-damaged skin      previously evaluated by Dermatology           Past Surgical History:  Procedure  Laterality Date   CATARACT EXTRACTION Bilateral 2014   Cervical spine surgery        x 2, last in 1996   COLON SURGERY   2020    Hemicolectomy for stage II colon cancer   COLONOSCOPY   12/16/2018    Invasive colorectal adenomcarcinoma/Repeat 1yr/MUS   COLONOSCOPY   12/23/2019    1 year postop exam, high-grade tubulovillous adenoma with focal high-grade dysplasia of the cecum.   COLONOSCOPY   12/21/2020    Tubulovillous adenoma/Tubular adenoma/Sessile serrated polyp, no high-grade dysplasia/Repeat 6 months/JWB   HERNIA REPAIR        right inguinal 12/14, Dr Wilton Smith   transurethral resection of prostate   08/13/2016          Social History          Socioeconomic History   Marital status: Married  Tobacco Use   Smoking status: Never Smoker   Smokeless tobacco: Never Used  Substance and Sexual Activity   Alcohol use: No   Drug use: Never             Allergies  Allergen Reactions   Oxycodone-Acetaminophen Unknown      Constipation, Retention per patient   Ciprofloxacin Diarrhea   Avodart [Dutasteride] Other (  See Comments)      intolerant   Flomax [Tamsulosin] Dizziness   Sulfamethoxazole-Trimethoprim Other (See Comments)      GI distress   Uroxatral [Alfuzosin] Dizziness      Current Medications        Current Outpatient Medications  Medication Sig Dispense Refill   diazePAM (VALIUM) 5 MG tablet Take 5 mg by mouth every 12 (twelve) hours as needed       hydroCHLOROthiazide (HYDRODIURIL) 12.5 MG tablet Take 6 mg by mouth 2 (two) times daily       prednisoLONE acetate (PRED FORTE) 1 % ophthalmic suspension Place 1 drop into both eyes once daily           No current facility-administered medications for this visit.             Family History  Problem Relation Age of Onset   Pulmonary fibrosis Mother     Diabetes Father     Prostate cancer Father          in his 60s   Liver cancer Brother     Benign prostatic hyperplasia Brother     Colon polyps  Brother             Objective:   Physical Exam Constitutional:      Appearance: Normal appearance.  Cardiovascular:     Rate and Rhythm: Normal rate and regular rhythm.     Pulses: Normal pulses.     Heart sounds: Normal heart sounds.  Pulmonary:     Effort: Pulmonary effort is normal.     Breath sounds: Normal breath sounds.  Musculoskeletal:     Cervical back: Neck supple.  Skin:    General: Skin is warm and dry.  Neurological:     Mental Status: He is alert and oriented to person, place, and time.  Psychiatric:        Behavior: Behavior normal.        Labs and Radiology:      Pathology/endoscopy reports from the colonoscopy of March 2021 and March 2022 were reviewed.   January 18, 2021 laboratory:   WBC (White Blood Cell Count) 4.1 - 10.2 10^3/uL 4.7   RBC (Red Blood Cell Count) 4.69 - 6.13 10^6/uL 4.47 Low    Hemoglobin 14.1 - 18.1 gm/dL 14.7   Hematocrit 40.0 - 52.0 % 43.2   MCV (Mean Corpuscular Volume) 80.0 - 100.0 fl 96.6   MCH (Mean Corpuscular Hemoglobin) 27.0 - 31.2 pg 32.9 High    MCHC (Mean Corpuscular Hemoglobin Concentration) 32.0 - 36.0 gm/dL 34.0   Platelet Count 150 - 450 10^3/uL 159   RDW-CV (Red Cell Distribution Width) 11.6 - 14.8 % 12.5   MPV (Mean Platelet Volume) 9.4 - 12.4 fl 10.8   Neutrophils 1.50 - 7.80 10^3/uL 1.72   Lymphocytes 1.00 - 3.60 10^3/uL 2.30   Monocytes 0.00 - 1.50 10^3/uL 0.43   Eosinophils 0.00 - 0.55 10^3/uL 0.20   Basophils 0.00 - 0.09 10^3/uL 0.03   Neutrophil % 32.0 - 70.0 % 36.7   Lymphocyte % 10.0 - 50.0 % 49.0   Monocyte % 4.0 - 13.0 % 9.2   Eosinophil % 1.0 - 5.0 % 4.3   Basophil% 0.0 - 2.0 % 0.6   Immature Granulocyte % <=0.7 % 0.2   Immature Granulocyte Count <=0.06 10^3/L 0.01     Glucose 70 - 110 mg/dL 100   Sodium 136 - 145 mmol/L 141   Potassium 3.6 - 5.1 mmol/L 4.4     Chloride 97 - 109 mmol/L 107   Carbon Dioxide (CO2) 22.0 - 32.0 mmol/L 28.1   Urea Nitrogen (BUN) 7 - 25 mg/dL 20   Creatinine 0.7 -  1.3 mg/dL 0.8   Glomerular Filtration Rate (eGFR), MDRD Estimate >60 mL/min/1.73sq m 95   Calcium 8.7 - 10.3 mg/dL 9.3   AST  8 - 39 U/L 27   ALT  6 - 57 U/L 37   Alk Phos (alkaline Phosphatase) 34 - 104 U/L 67   Albumin 3.5 - 4.8 g/dL 4.3   Bilirubin, Total 0.3 - 1.2 mg/dL 1.2   Protein, Total 6.1 - 7.9 g/dL 6.4   A/G Ratio 1.0 - 5.0 gm/dL 2.0     CEA prior to surgery was 2.7.  After surgery 2.0.  March 2021: 1.7.      Assessment:     Candidate for repeat colonoscopy based on her early recurrent cecal polyp.   Significant vertigo pending University evaluation.    Plan:     The patient is due to meet with the [facility] faculty in 2 weeks.  We will postpone his colonoscopy and until after that evaluation to minimize the risk of dehydration precipitating a vertiginous event.   Preparation for the procedure was reviewed with him by the staff.      This note is partially prepared by Marsha Hatch, RN, acting as a scribe in the presence of Dr. Jeffrey Byrnett, MD.  The documentation recorded by the scribe accurately reflects the service I personally performed and the decisions made by me.    Jeffrey W. Byrnett, MD FACS  December 21, 2020 pathology:  DIAGNOSIS:  A. COLON POLYP, CECUM; HOT SNARE:  - TUBULOVILLOUS ADENOMA.  - NEGATIVE FOR HIGH-GRADE DYSPLASIA AND MALIGNANCY.   B. COLON POLYP X3, ASCENDING; HOT SNARE:  - MULTIPLE FRAGMENTS OF SESSILE SERRATED POLYP AND TUBULAR ADENOMA.  - SEE COMMENT.   Comment:  Due to the fragmented nature of the specimen, it is unclear whether this  represents sessile serrated polyp with dysplasia, or separate fragments  of tubular adenoma alongside fragments of sessile serrated polyp without  dysplasia.  There is no evidence of high-grade dysplasia or malignancy   

## 2021-08-29 ENCOUNTER — Encounter: Payer: Self-pay | Admitting: General Surgery

## 2021-08-30 ENCOUNTER — Other Ambulatory Visit: Payer: Self-pay

## 2021-08-30 ENCOUNTER — Ambulatory Visit: Payer: PPO | Admitting: Certified Registered"

## 2021-08-30 ENCOUNTER — Ambulatory Visit
Admission: RE | Admit: 2021-08-30 | Discharge: 2021-08-30 | Disposition: A | Discharge status: Home / Self Care | Payer: PPO | Attending: General Surgery | Admitting: General Surgery

## 2021-08-30 ENCOUNTER — Encounter
Admission: RE | Disposition: A | Discharge status: Home / Self Care | Payer: Self-pay | Source: Home / Self Care | Attending: General Surgery

## 2021-08-30 ENCOUNTER — Encounter: Payer: Self-pay | Admitting: General Surgery

## 2021-08-30 DIAGNOSIS — Z85038 Personal history of other malignant neoplasm of large intestine: Secondary | ICD-10-CM | POA: Insufficient documentation

## 2021-08-30 DIAGNOSIS — Z85828 Personal history of other malignant neoplasm of skin: Secondary | ICD-10-CM | POA: Diagnosis not present

## 2021-08-30 DIAGNOSIS — Z09 Encounter for follow-up examination after completed treatment for conditions other than malignant neoplasm: Secondary | ICD-10-CM | POA: Diagnosis present

## 2021-08-30 DIAGNOSIS — Z9049 Acquired absence of other specified parts of digestive tract: Secondary | ICD-10-CM | POA: Diagnosis not present

## 2021-08-30 DIAGNOSIS — D122 Benign neoplasm of ascending colon: Secondary | ICD-10-CM | POA: Insufficient documentation

## 2021-08-30 DIAGNOSIS — Z86010 Personal history of colonic polyps: Secondary | ICD-10-CM | POA: Diagnosis not present

## 2021-08-30 DIAGNOSIS — D126 Benign neoplasm of colon, unspecified: Secondary | ICD-10-CM | POA: Diagnosis not present

## 2021-08-30 HISTORY — PX: COLONOSCOPY WITH PROPOFOL: SHX5780

## 2021-08-30 SURGERY — COLONOSCOPY WITH PROPOFOL
Anesthesia: General

## 2021-08-30 MED ORDER — PROPOFOL 10 MG/ML IV BOLUS
INTRAVENOUS | Status: DC | PRN
  Administered 2021-08-30: 60 mg via INTRAVENOUS

## 2021-08-30 MED ORDER — SODIUM CHLORIDE 0.9 % IV SOLN: INTRAVENOUS | Status: DC

## 2021-08-30 MED ORDER — PROPOFOL 500 MG/50ML IV EMUL
INTRAVENOUS | Status: AC
  Filled 2021-08-30: qty 50

## 2021-08-30 MED ORDER — LIDOCAINE HCL (CARDIAC) PF 100 MG/5ML IV SOSY
PREFILLED_SYRINGE | INTRAVENOUS | Status: DC | PRN
  Administered 2021-08-30: 50 mg via INTRAVENOUS

## 2021-08-30 MED ORDER — PROPOFOL 500 MG/50ML IV EMUL
INTRAVENOUS | Status: DC | PRN
  Administered 2021-08-30: 150 ug/kg/min via INTRAVENOUS

## 2021-08-30 NOTE — H&P (Signed)
Darin Bell [id number] [birth year]     HPI: 75 y/o male with tubulovillous polyp of the cecum s/p endoscopic excision March 2022 with argon treatment of base. No atypia. For repeat exam.  Tolerated prep well. Got woozy with failed IV start this AM.    Had his [facility] evaluation: no new recommendations re: vertigo.   Medications Prior to Admission  Medication Sig Dispense Refill Last Dose   diazepam (VALIUM) 5 MG tablet Take 5 mg by mouth every 12 (twelve) hours as needed for anxiety.   Past Month   hydrochlorothiazide (MICROZIDE) 12.5 MG capsule Take 12.5 mg by mouth daily.   08/29/2021 at 2100   prednisoLONE acetate (PRED FORTE) 1 % ophthalmic suspension Place 1 drop into both eyes daily.   08/29/2021   ibuprofen (ADVIL,MOTRIN) 200 MG tablet Take 1 tablet (200 mg total) by mouth every 4 (four) hours as needed for mild pain. (Patient not taking: Reported on 08/30/2021) 30 tablet 0 Not Taking   Allergies  Allergen Reactions   Oxycodone-Acetaminophen     Constipation, Retention per patient   Ciprofloxacin Diarrhea   Alfuzosin     Dizziness   Bactrim [Sulfamethoxazole-Trimethoprim] Other (See Comments)    GI distress   Dutasteride    Tamsulosin     Dizziness   Past Medical History:  Diagnosis Date   Benign localized hyperplasia of prostate with urinary obstruction and other lower urinary tract symptoms (LUTS) 04/15/2013   BPH (benign prostatic hyperplasia)    Cancer (HCC)    skin cancer   Cancer of sigmoid colon ([place]) 12/29/2018   T3, N0, lymphovascular invasion.  Margins clear. No loss of MMR proteins.    Enlarged prostate with lower urinary tract symptoms (LUTS) 04/15/2013   Family history of prostate cancer 04/15/2013   History of colon cancer    History of degenerative disc disease 07/20/2016   Overview:  Status post C-spine surgery x 2, last in 1996   Hyperlipidemia    Incomplete emptying of bladder 04/15/2013   Post-void dribbling 04/15/2013   Pure hypercholesterolemia 09/08/2014    Retention of urine 04/15/2013   Squamous cell carcinoma of skin 2011   left forearm   Past Surgical History:  Procedure Laterality Date   BACK SURGERY  1994   cervical disc   BACK SURGERY  1996   cervical spine surgery   COLON SURGERY  12/16/2018   hemicolectomy   COLONOSCOPY WITH PROPOFOL N/A 12/16/2018   Procedure: COLONOSCOPY WITH PROPOFOL;  Surgeon: Darin Sails, MD;  Location: [facility] ENDOSCOPY;  Service: Endoscopy;  Laterality: N/A;   COLONOSCOPY WITH PROPOFOL N/A 12/23/2019   Procedure: COLONOSCOPY WITH PROPOFOL;  Surgeon: Darin Bellow, MD;  Location: [facility] ENDOSCOPY;  Service: Endoscopy;  Laterality: N/A;  Need CEA on arrival   COLONOSCOPY WITH PROPOFOL N/A 12/21/2020   Procedure: COLONOSCOPY WITH PROPOFOL;  Surgeon: Darin Bellow, MD;  Location: [place] ENDOSCOPY;  Service: Endoscopy;  Laterality: N/A;  1st case   EYE SURGERY Bilateral    Cataract Extraction with IOL   HERNIA REPAIR Right 2015   Inguinal Hernia Repair   LAPAROSCOPIC SIGMOID COLECTOMY N/A 12/29/2018   Procedure: LAPAROSCOPIC SIGMOID COLECTOMY;  Surgeon: Darin Bellow, MD;  Location: [facility] ORS;  Service: General;  Laterality: N/A;   TRANSURETHRAL RESECTION OF PROSTATE N/A 08/13/2016   Procedure: TRANSURETHRAL RESECTION OF THE PROSTATE (TURP);  Surgeon: Darin Espy, MD;  Location: [facility] ORS;  Service: Urology;  Laterality: N/A;   Social History   Socioeconomic History  Marital status: Single    Spouse name: Not on file   Number of children: 3   Years of education: Not on file   Highest education level: Not on file  Occupational History   Not on file  Tobacco Use   Smoking status: Never   Smokeless tobacco: Never  Vaping Use   Vaping Use: Never used  Substance and Sexual Activity   Alcohol use: Never   Drug use: No   Sexual activity: Not on file  Other Topics Concern   Not on file  Social History Narrative   Darin Bell; no alcohol; no smoking. [name]; lives with wife.    Social  Determinants of Health   Financial Resource Strain: Not on file  Food Insecurity: Not on file  Transportation Needs: Not on file  Physical Activity: Not on file  Stress: Not on file  Social Connections: Not on file  Intimate Partner Violence: Not on file   Social History   Social History Narrative   Darin Bell; no alcohol; no smoking. [name]; lives with wife.      ROS: Negative.     PE: HEENT: Negative. Lungs: Clear. Cardio: RR.  Assessment/Plan:  Proceed with planned endoscopy.   Forest Gleason [facility] 08/30/2021

## 2021-08-30 NOTE — Transfer of Care (Signed)
Immediate Anesthesia Transfer of Care Note  Patient: Darin Bell  Procedure(s) Performed: COLONOSCOPY WITH PROPOFOL  Patient Location: PACU and Endoscopy Unit  Anesthesia Type:General  Level of Consciousness: drowsy  Airway & Oxygen Therapy: Patient Spontanous Breathing  Post-op Assessment: Report given to RN and Post -op Vital signs reviewed and stable  Post vital signs: Reviewed and stable  Last Vitals:  Vitals Value Taken Time  BP 102/53 08/30/21 0759  Temp 36.4 C 08/30/21 0758  Pulse 62 08/30/21 0800  Resp 14 08/30/21 0800  SpO2 96 % 08/30/21 0800  Vitals shown include unvalidated device data.  Last Pain:  Vitals:   08/30/21 0758  TempSrc: Temporal  PainSc: 0-No pain         Complications: No notable events documented.

## 2021-08-30 NOTE — Anesthesia Postprocedure Evaluation (Signed)
Anesthesia Post Note  Patient: Darin Bell  Procedure(s) Performed: COLONOSCOPY WITH PROPOFOL  Patient location during evaluation: Endoscopy Anesthesia Type: General Level of consciousness: awake and alert Pain management: pain level controlled Vital Signs Assessment: post-procedure vital signs reviewed and stable Respiratory status: spontaneous breathing, nonlabored ventilation, respiratory function stable and patient connected to nasal cannula oxygen Cardiovascular status: blood pressure returned to baseline and stable Postop Assessment: no apparent nausea or vomiting Anesthetic complications: no   No notable events documented.   Last Vitals:  Vitals:   08/30/21 0758 08/30/21 0808  BP: (!) 102/53 110/75  Pulse: 62   Resp: 14   Temp: 36.4 C   SpO2: 97% 98%    Last Pain:  Vitals:   08/30/21 0818  TempSrc:   PainSc: 0-No pain                 Margaree Mackintosh

## 2021-08-30 NOTE — Op Note (Signed)
[facility] Patient Name: Darin Bell Procedure Date: 08/30/2021 7:38 AM MRN: [id number] Account #: [id number] Date of Birth: 1946-06-11 Admit Type: Outpatient Age: 75 Room: [facility] ENDO ROOM 1 Gender: Male Note Status: Finalized Instrument Name: Peds Colonoscope [id number] Procedure:             Colonoscopy Indications:           High risk colon cancer surveillance: Personal history                         of colonic polyps Providers:             Robert Bellow, MD Medicines:             Propofol per Anesthesia Complications:         No immediate complications. Procedure:             Pre-Anesthesia Assessment:                        - Prior to the procedure, a History and Physical was                         performed, and patient medications, allergies and                         sensitivities were reviewed. The patient's tolerance                         of previous anesthesia was reviewed.                        - The risks and benefits of the procedure and the                         sedation options and risks were discussed with the                         patient. All questions were answered and informed                         consent was obtained.                        After obtaining informed consent, the colonoscope was                         passed under direct vision. Throughout the procedure,                         the patient's blood pressure, pulse, and oxygen                         saturations were monitored continuously. The                         Colonoscope was introduced through the anus and                         advanced to the the terminal ileum. The colonoscopy  was performed without difficulty. The patient                         tolerated the procedure well. The quality of the bowel                         preparation was excellent. Findings:      Two sessile polyps were found in the ascending  colon. The polyps were 4       mm in size. These were biopsied with a cold large-capacity forceps for       histology.      The retroflexed view of the distal rectum and anal verge was normal and       showed no anal or rectal abnormalities. Impression:            - Two 4 mm polyps in the ascending colon. Biopsied.                        - The distal rectum and anal verge are normal on                         retroflexion view. Recommendation:        - Telephone endoscopist for pathology results in 1                         week. Procedure Code(s):     --- Professional ---                        [phone], Colonoscopy, flexible; with biopsy, single or                         multiple Diagnosis Code(s):     --- Professional ---                        Z86.010, Personal history of colonic polyps                        K63.5, Polyp of colon CPT copyright 2019 [employer]. All rights reserved. The codes documented in this report are preliminary and upon coder review may  be revised to meet current compliance requirements. Robert Bellow, MD 08/30/2021 7:57:22 AM This report has been signed electronically. Number of Addenda: 0 Note Initiated On: 08/30/2021 7:38 AM Scope Withdrawal Time: 0 hours 9 minutes 57 seconds  Total Procedure Duration: 0 hours 12 minutes 36 seconds  Estimated Blood Loss:  Estimated blood loss: none.      [facility]

## 2021-08-30 NOTE — Anesthesia Preprocedure Evaluation (Signed)
Anesthesia Evaluation  Patient identified by MRN, date of birth, ID band Patient awake    Reviewed: Allergy & Precautions, NPO status , Patient's Chart, lab work & pertinent test results  Airway Mallampati: III  TM Distance: >3 FB Neck ROM: full    Dental   Pulmonary neg pulmonary ROS,    Pulmonary exam normal        Cardiovascular negative cardio ROS Normal cardiovascular exam     Neuro/Psych negative neurological ROS  negative psych ROS   GI/Hepatic negative GI ROS, Neg liver ROS,   Endo/Other  negative endocrine ROS  Renal/GU negative Renal ROS  negative genitourinary   Musculoskeletal   Abdominal Normal abdominal exam  (+)   Peds  Hematology negative hematology ROS (+)   Anesthesia Other Findings Past Medical History: 04/15/2013: Benign localized hyperplasia of prostate with urinary  obstruction and other lower urinary tract symptoms (LUTS) No date: BPH (benign prostatic hyperplasia) No date: Cancer ([facility])     Comment:  skin cancer 12/29/2018: Cancer of sigmoid colon ([facility])     Comment:  T3, N0, lymphovascular invasion.  Margins clear. No loss              of MMR proteins.  04/15/2013: Enlarged prostate with lower urinary tract symptoms (LUTS) 04/15/2013: Family history of prostate cancer No date: History of colon cancer 07/20/2016: History of degenerative disc disease     Comment:  Overview:  Status post C-spine surgery x 2, last in 1996 No date: Hyperlipidemia 04/15/2013: Incomplete emptying of bladder 04/15/2013: Post-void dribbling 09/08/2014: Pure hypercholesterolemia 04/15/2013: Retention of urine 2011: Squamous cell carcinoma of skin     Comment:  left forearm  Past Surgical History: 1994: BACK SURGERY     Comment:  cervical disc 1996: BACK SURGERY     Comment:  cervical spine surgery 12/16/2018: COLON SURGERY     Comment:  hemicolectomy 12/16/2018: COLONOSCOPY WITH PROPOFOL; N/A     Comment:  Procedure:  COLONOSCOPY WITH PROPOFOL;  Surgeon:               Lollie Sails, MD;  Location: [facility] ENDOSCOPY;                Service: Endoscopy;  Laterality: N/A; 12/23/2019: COLONOSCOPY WITH PROPOFOL; N/A     Comment:  Procedure: COLONOSCOPY WITH PROPOFOL;  Surgeon: Robert Bellow, MD;  Location: [facility] ENDOSCOPY;  Service:               Endoscopy;  Laterality: N/A;  Need CEA on arrival 12/21/2020: COLONOSCOPY WITH PROPOFOL; N/A     Comment:  Procedure: COLONOSCOPY WITH PROPOFOL;  Surgeon: Robert Bellow, MD;  Location: [facility]OSCOPY;  Service:               Endoscopy;  Laterality: N/A;  1st case No date: EYE SURGERY; Bilateral     Comment:  Cataract Extraction with IOL 2015: HERNIA REPAIR; Right     Comment:  Inguinal Hernia Repair 12/29/2018: LAPAROSCOPIC SIGMOID COLECTOMY; N/A     Comment:  Procedure: LAPAROSCOPIC SIGMOID COLECTOMY;  Surgeon:               Robert Bellow, MD;  Location: [facility] ORS;  Service:               General;  Laterality: N/A; 08/13/2016: TRANSURETHRAL RESECTION OF PROSTATE;  N/A     Comment:  Procedure: TRANSURETHRAL RESECTION OF THE PROSTATE               (TURP);  Surgeon: Hollice Espy, MD;  Location: [facility]               ORS;  Service: Urology;  Laterality: N/A;     Reproductive/Obstetrics negative OB ROS                             Anesthesia Physical Anesthesia Plan  ASA: 3  Anesthesia Plan: General   Post-op Pain Management:    Induction: Intravenous  PONV Risk Score and Plan: Propofol infusion and TIVA  Airway Management Planned: Natural Airway and Nasal Cannula  Additional Equipment:   Intra-op Plan:   Post-operative Plan:   Informed Consent: I have reviewed the patients History and Physical, chart, labs and discussed the procedure including the risks, benefits and alternatives for the proposed anesthesia with the patient or authorized representative who has indicated his/her  understanding and acceptance.     Dental Advisory Given  Plan Discussed with: Anesthesiologist, CRNA and Surgeon  Anesthesia Plan Comments: (Patient consented for risks of anesthesia including but not limited to:  - adverse reactions to medications - risk of airway placement if required - damage to eyes, teeth, lips or other oral mucosa - nerve damage due to positioning  - sore throat or hoarseness - Damage to heart, brain, nerves, lungs, other parts of body or loss of life  Patient voiced understanding.)        Anesthesia Quick Evaluation

## 2021-08-30 NOTE — Anesthesia Procedure Notes (Signed)
Procedure Name: MAC Date/Time: 08/30/2021 7:41 AM Performed by: Biagio Borg, CRNA Pre-anesthesia Checklist: Patient identified, Emergency Drugs available, Suction available, Patient being monitored and Timeout performed Patient Re-evaluated:Patient Re-evaluated prior to induction Oxygen Delivery Method: Nasal cannula Induction Type: IV induction Placement Confirmation: positive ETCO2 and CO2 detector

## 2021-08-31 ENCOUNTER — Encounter: Payer: Self-pay | Admitting: General Surgery

## 2021-08-31 LAB — SURGICAL PATHOLOGY

## 2022-01-01 ENCOUNTER — Encounter: Payer: Self-pay | Admitting: Dermatology

## 2022-01-01 DIAGNOSIS — H8102 Meniere's disease, left ear: Secondary | ICD-10-CM | POA: Diagnosis not present

## 2022-01-01 DIAGNOSIS — R42 Dizziness and giddiness: Secondary | ICD-10-CM | POA: Diagnosis not present

## 2022-01-22 DIAGNOSIS — R3911 Hesitancy of micturition: Secondary | ICD-10-CM | POA: Diagnosis not present

## 2022-01-22 DIAGNOSIS — Z125 Encounter for screening for malignant neoplasm of prostate: Secondary | ICD-10-CM | POA: Diagnosis not present

## 2022-01-22 DIAGNOSIS — E7800 Pure hypercholesterolemia, unspecified: Secondary | ICD-10-CM | POA: Diagnosis not present

## 2022-01-22 DIAGNOSIS — N401 Enlarged prostate with lower urinary tract symptoms: Secondary | ICD-10-CM | POA: Diagnosis not present

## 2022-01-22 DIAGNOSIS — Z8739 Personal history of other diseases of the musculoskeletal system and connective tissue: Secondary | ICD-10-CM | POA: Diagnosis not present

## 2022-01-29 DIAGNOSIS — Z8739 Personal history of other diseases of the musculoskeletal system and connective tissue: Secondary | ICD-10-CM | POA: Diagnosis not present

## 2022-01-29 DIAGNOSIS — Z125 Encounter for screening for malignant neoplasm of prostate: Secondary | ICD-10-CM | POA: Diagnosis not present

## 2022-01-29 DIAGNOSIS — R3911 Hesitancy of micturition: Secondary | ICD-10-CM | POA: Diagnosis not present

## 2022-01-29 DIAGNOSIS — N401 Enlarged prostate with lower urinary tract symptoms: Secondary | ICD-10-CM | POA: Diagnosis not present

## 2022-01-29 DIAGNOSIS — Z0000 Encounter for general adult medical examination without abnormal findings: Secondary | ICD-10-CM | POA: Diagnosis not present

## 2022-01-29 DIAGNOSIS — R748 Abnormal levels of other serum enzymes: Secondary | ICD-10-CM | POA: Diagnosis not present

## 2022-01-29 DIAGNOSIS — E7800 Pure hypercholesterolemia, unspecified: Secondary | ICD-10-CM | POA: Diagnosis not present

## 2022-01-30 ENCOUNTER — Ambulatory Visit: Payer: PPO | Admitting: Dermatology

## 2022-01-30 DIAGNOSIS — D229 Melanocytic nevi, unspecified: Secondary | ICD-10-CM | POA: Diagnosis not present

## 2022-01-30 DIAGNOSIS — L821 Other seborrheic keratosis: Secondary | ICD-10-CM

## 2022-01-30 DIAGNOSIS — Z1283 Encounter for screening for malignant neoplasm of skin: Secondary | ICD-10-CM

## 2022-01-30 DIAGNOSIS — L820 Inflamed seborrheic keratosis: Secondary | ICD-10-CM | POA: Diagnosis not present

## 2022-01-30 DIAGNOSIS — Z85828 Personal history of other malignant neoplasm of skin: Secondary | ICD-10-CM | POA: Diagnosis not present

## 2022-01-30 DIAGNOSIS — L814 Other melanin hyperpigmentation: Secondary | ICD-10-CM

## 2022-01-30 DIAGNOSIS — L570 Actinic keratosis: Secondary | ICD-10-CM | POA: Diagnosis not present

## 2022-01-30 DIAGNOSIS — L578 Other skin changes due to chronic exposure to nonionizing radiation: Secondary | ICD-10-CM

## 2022-01-30 DIAGNOSIS — D1800 Hemangioma unspecified site: Secondary | ICD-10-CM | POA: Diagnosis not present

## 2022-01-30 DIAGNOSIS — H61001 Unspecified perichondritis of right external ear: Secondary | ICD-10-CM | POA: Diagnosis not present

## 2022-01-30 DIAGNOSIS — D2239 Melanocytic nevi of other parts of face: Secondary | ICD-10-CM

## 2022-01-30 NOTE — Patient Instructions (Addendum)
Actinic keratoses are precancerous spots that appear secondary to cumulative UV radiation exposure/sun exposure over time. They are chronic with expected duration over 1 year. A portion of actinic keratoses will progress to squamous cell carcinoma of the skin. It is not possible to reliably predict which spots will progress to skin cancer and so treatment is recommended to prevent development of skin cancer. ? ?Recommend daily broad spectrum sunscreen SPF 30+ to sun-exposed areas, reapply every 2 hours as needed.  ?Recommend staying in the shade or wearing long sleeves, sun glasses (UVA+UVB protection) and wide brim hats (4-inch brim around the entire circumference of the hat). ?Call for new or changing lesions.  ? ?Cryotherapy Aftercare ? ?Wash gently with soap and water everyday.   ?Apply Vaseline and Band-Aid daily until healed.  ? ?Seborrheic Keratosis ? ?What causes seborrheic keratoses? ?Seborrheic keratoses are harmless, common skin growths that first appear during adult life.  As time goes by, more growths appear.  Some people may develop a large number of them.  Seborrheic keratoses appear on both covered and uncovered body parts.  They are not caused by sunlight.  The tendency to develop seborrheic keratoses can be inherited.  They vary in color from skin-colored to gray, brown, or even black.  They can be either smooth or have a rough, warty surface.   ?Seborrheic keratoses are superficial and look as if they were stuck on the skin.  Under the microscope this type of keratosis looks like layers upon layers of skin.  That is why at times the top layer may seem to fall off, but the rest of the growth remains and re-grows.   ? ?Treatment ?Seborrheic keratoses do not need to be treated, but can easily be removed in the office.  Seborrheic keratoses often cause symptoms when they rub on clothing or jewelry.  Lesions can be in the way of shaving.  If they become inflamed, they can cause itching, soreness, or  burning.  Removal of a seborrheic keratosis can be accomplished by freezing, burning, or surgery. ?If any spot bleeds, scabs, or grows rapidly, please return to have it checked, as these can be an indication of a skin cancer. ? ? ? ?Melanoma ABCDEs ? ?Melanoma is the most dangerous type of skin cancer, and is the leading cause of death from skin disease.  You are more likely to develop melanoma if you: ?Have light-colored skin, light-colored eyes, or red or blond hair ?Spend a lot of time in the sun ?Tan regularly, either outdoors or in a tanning bed ?Have had blistering sunburns, especially during childhood ?Have a close family member who has had a melanoma ?Have atypical moles or large birthmarks ? ?Early detection of melanoma is key since treatment is typically straightforward and cure rates are extremely high if we catch it early.  ? ?The first sign of melanoma is often a change in a mole or a new dark spot.  The ABCDE system is a way of remembering the signs of melanoma. ? ?A for asymmetry:  The two halves do not match. ?B for border:  The edges of the growth are irregular. ?C for color:  A mixture of colors are present instead of an even brown color. ?D for diameter:  Melanomas are usually (but not always) greater than 27m - the size of a pencil eraser. ?E for evolution:  The spot keeps changing in size, shape, and color. ? ?Please check your skin once per month between visits. You can use a  small mirror in front and a large mirror behind you to keep an eye on the back side or your body.  ? ?If you see any new or changing lesions before your next follow-up, please call to schedule a visit. ? ?Please continue daily skin protection including broad spectrum sunscreen SPF 30+ to sun-exposed areas, reapplying every 2 hours as needed when you're outdoors.   ? ? ? ?If You Need Anything After Your Visit ? ?If you have any questions or concerns for your doctor, please call our main line at [phone] and press option  4 to reach your doctor's medical assistant. If no one answers, please leave a voicemail as directed and we will return your call as soon as possible. Messages left after 4 pm will be answered the following business day.  ? ?You may also send [place] a message via MyChart. We typically respond to MyChart messages within 1-2 business days. ? ?For prescription refills, please ask your pharmacy to contact our office. Our fax number is [phone]. ? ?If you have an urgent issue when the clinic is closed that cannot wait until the next business day, you can page your doctor at the number below.   ? ?Please note that while we do our best to be available for urgent issues outside of office hours, we are not available 24/7.  ? ?If you have an urgent issue and are unable to reach [place], you may choose to seek medical care at your doctor's office, retail clinic, urgent care center, or emergency room. ? ?If you have a medical emergency, please immediately call 911 or go to the emergency department. ? ?Pager Numbers ? ?- Dr. Nehemiah Massed: [phone] ? ?- Dr. Laurence Ferrari: [phone] ? ?- Dr. Nicole Kindred: [phone] ? ?In the event of inclement weather, please call our main line at [phone] for an update on the status of any delays or closures. ? ?Dermatology Medication Tips: ?Please keep the boxes that topical medications come in in order to help keep track of the instructions about where and how to use these. Pharmacies typically print the medication instructions only on the boxes and not directly on the medication tubes.  ? ?If your medication is too expensive, please contact our office at [phone] option 4 or send [place] a message through [place].  ? ?We are unable to tell what your co-pay for medications will be in advance as this is different depending on your insurance coverage. However, we may be able to find a substitute medication at lower cost or fill out paperwork to get insurance to cover a needed medication.  ? ?If a prior  authorization is required to get your medication covered by your insurance company, please allow [place] 1-2 business days to complete this process. ? ?Drug prices often vary depending on where the prescription is filled and some pharmacies may offer cheaper prices. ? ?The website www.goodrx.com contains coupons for medications through different pharmacies. The prices here do not account for what the cost may be with help from insurance (it may be cheaper with your insurance), but the website can give you the price if you did not use any insurance.  ?- You can print the associated coupon and take it with your prescription to the pharmacy.  ?- You may also stop by our office during regular business hours and pick up a GoodRx coupon card.  ?- If you need your prescription sent electronically to a different pharmacy, notify our office through [facility] or by phone at  [phone] option 4. ? ? ? ? ?Si Usted Necesita Algo Despu?s de Su Visita ? ?Tambi?n puede enviarnos un mensaje a trav?s de MyChart. Por lo general respondemos a los mensajes de MyChart en el transcurso de 1 a 2 d?as h?biles. ? ?Para renovar recetas, por favor pida a su farmacia que se ponga en contacto con nuestra oficina. Nuestro n?mero de fax es el [phone]. ? ?Si tiene un asunto urgente cuando la cl?nica est? cerrada y que no puede esperar hasta el siguiente d?a h?bil, puede llamar/localizar a su doctor(a) al n?mero que aparece a continuaci?n.  ? ?Por favor, tenga en cuenta que aunque hacemos todo lo posible para estar disponibles para asuntos urgentes fuera del horario de oficina, no estamos disponibles las 24 horas del d?a, los 7 d?as de la semana.  ? ?Si tiene un problema urgente y no puede comunicarse con nosotros, puede optar por buscar atenci?n m?dica  en el consultorio de su doctor(a), en una cl?nica privada, en un centro de atenci?n urgente o en una sala de emergencias. ? ?Si tiene Engineer, maintenance (IT) m?dica, por favor llame  inmediatamente al 911 o vaya a la sala de emergencias. ? ?N?meros de b?per ? ?- Dr. Nehemiah Massed: [phone] ? ?- Dra. Moye: [phone] ? ?- Dra. Nicole Kindred: [phone] ? ?En caso de inclemencias del tiempo, por favor l

## 2022-01-30 NOTE — Progress Notes (Signed)
? ?Follow-Up Visit ?  ?Subjective  ?Darin Bell is a 76 y.o. male who presents for the following: upper body exam (Spot at left neck, scalp ,  left side of face. Hx of scc, aks, isks. ). ? ? ?The patient presents for Upper Body Skin Exam (UBSE) for skin cancer screening and mole check.  The patient has spots, moles and lesions to be evaluated, some may be new or changing and the patient has concerns that these could be cancer. ? ? ?The following portions of the chart were reviewed this encounter and updated as appropriate:   ?  ? ?Review of Systems: No other skin or systemic complaints except as noted in HPI or Assessment and Plan. ? ? ?Objective  ?Well appearing patient in no apparent distress; mood and affect are within normal limits. ? ?All skin waist up examined. ? ?Right Ear ?3 mm firm flesh white papule  ? ?left ulnar hand x 1, right lateral eye x 1 (2) ?Pink scaly papules  ? ?left crown x 1, left neck x 1 (2) ?Erythematous stuck-on, waxy papule or plaque ? ?Left Malar Cheek ?4 mm flesh firm papule  ? ? ?Assessment & Plan  ?Chondrodermatitis nodularis helicis of right ear ?Right Ear ? ?Benign-appearing.  Asymptomatic and Stable. Observe. ? ?Actinic keratosis (2) ?left ulnar hand x 1, right lateral eye x 1 ? ?Actinic keratoses are precancerous spots that appear secondary to cumulative UV radiation exposure/sun exposure over time. They are chronic with expected duration over 1 year. A portion of actinic keratoses will progress to squamous cell carcinoma of the skin. It is not possible to reliably predict which spots will progress to skin cancer and so treatment is recommended to prevent development of skin cancer. ? ?Recommend daily broad spectrum sunscreen SPF 30+ to sun-exposed areas, reapply every 2 hours as needed.  ?Recommend staying in the shade or wearing long sleeves, sun glasses (UVA+UVB protection) and wide brim hats (4-inch brim around the entire circumference of the hat). ?Call for new or changing  lesions. ? ?Destruction of lesion - left ulnar hand x 1, right lateral eye x 1 ? ?Destruction method: cryotherapy   ?Informed consent: discussed and consent obtained   ?Lesion destroyed using liquid nitrogen: Yes   ?Region frozen until ice ball extended beyond lesion: Yes   ?Outcome: patient tolerated procedure well with no complications   ?Post-procedure details: wound care instructions given   ?Additional details:  Prior to procedure, discussed risks of blister formation, small wound, skin dyspigmentation, or rare scar following cryotherapy. Recommend Vaseline ointment to treated areas while healing. ? ? ?Inflamed seborrheic keratosis (2) ?left crown x 1, left neck x 1 ? ?Destruction of lesion - left crown x 1, left neck x 1 ? ?Destruction method: cryotherapy   ?Informed consent: discussed and consent obtained   ?Lesion destroyed using liquid nitrogen: Yes   ?Region frozen until ice ball extended beyond lesion: Yes   ?Outcome: patient tolerated procedure well with no complications   ?Post-procedure details: wound care instructions given   ?Additional details:  Prior to procedure, discussed risks of blister formation, small wound, skin dyspigmentation, or rare scar following cryotherapy. Recommend Vaseline ointment to treated areas while healing. ? ? ?Nevus ?Left Malar Cheek ? ?Benign-appearing. Stable. Observe for changes. ? ?Irritated by shaving ?May consider shave removal in future if irritated ?Patient defers treatment today ? ? ?Lentigines ?- Scattered tan macules ?- Due to sun exposure ?- Benign-appearing, observe ?- Recommend daily broad spectrum sunscreen  SPF 30+ to sun-exposed areas, reapply every 2 hours as needed. ?- Call for any changes ? ?Seborrheic Keratoses ?- Stuck-on, waxy, tan-brown papules and/or plaques at left infraoccular  ?- Benign-appearing ?- Discussed benign etiology and prognosis. ?- Observe ?- Call for any changes ? ?Melanocytic Nevi ?- Tan-brown and/or pink-flesh-colored symmetric  macules and papules ?- Benign appearing on exam today ?- Observation ?- Call clinic for new or changing moles ?- Recommend daily use of broad spectrum spf 30+ sunscreen to sun-exposed areas.  ? ?Hemangiomas ?- Red papules ?- Discussed benign nature ?- Observe ?- Call for any changes ? ?Actinic Damage ?- Chronic condition, secondary to cumulative UV/sun exposure ?- diffuse scaly erythematous macules with underlying dyspigmentation ?- Recommend daily broad spectrum sunscreen SPF 30+ to sun-exposed areas, reapply every 2 hours as needed.  ?- Staying in the shade or wearing long sleeves, sun glasses (UVA+UVB protection) and wide brim hats (4-inch brim around the entire circumference of the hat) are also recommended for sun protection.  ?- Call for new or changing lesions. ? ?History of Squamous Cell Carcinoma of the Skin ?- No evidence of recurrence today at left forearm  ?- Recommend regular full body skin exams ?- Recommend daily broad spectrum sunscreen SPF 30+ to sun-exposed areas, reapply every 2 hours as needed.  ?- Call if any new or changing lesions are noted between office visits ? ?Skin cancer screening performed today. ?Return in about 6 months (around 08/01/2022) for ak followup. ?I, Ruthell Rummage, CMA, am acting as scribe for Brendolyn Patty, MD. ? ?Documentation: I have reviewed the above documentation for accuracy and completeness, and I agree with the above. ? ?Brendolyn Patty MD  ? ?

## 2022-02-23 DIAGNOSIS — R748 Abnormal levels of other serum enzymes: Secondary | ICD-10-CM | POA: Diagnosis not present

## 2022-02-23 DIAGNOSIS — E7800 Pure hypercholesterolemia, unspecified: Secondary | ICD-10-CM | POA: Diagnosis not present

## 2022-03-22 DIAGNOSIS — R748 Abnormal levels of other serum enzymes: Secondary | ICD-10-CM | POA: Diagnosis not present

## 2022-04-08 IMAGING — MR MR HEAD W/O CM
11 series · 39 of 48 positions shown · non-contrast
Comparison: None available.

CLINICAL DATA: Initial evaluation for acute TIA.

EXAM:
MRI HEAD WITHOUT CONTRAST
TECHNIQUE: Multiplanar, multiecho pulse sequences of the brain and surrounding
structures were obtained without intravenous contrast.

[Series 5: ax dwi_tracew · axial · 3.0mm · 0.65mm/px · z∈[-71,+81]mm · 3 of 48 slices shown]
[im 1/48]
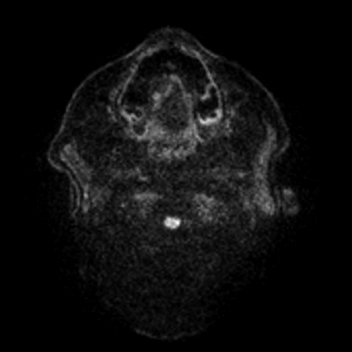
[im 24/48]
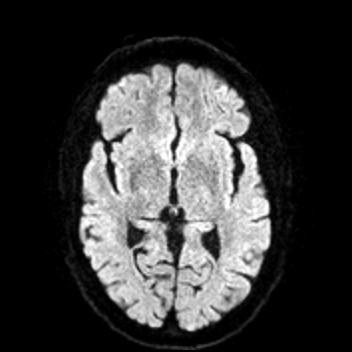
[im 48/48]
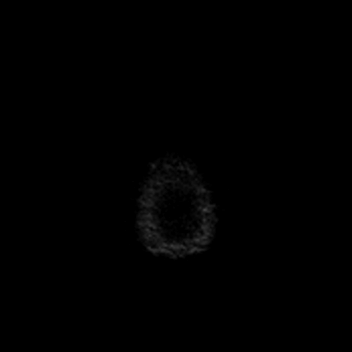

[Series 6: ax dwi_adc · axial · 3.0mm · 0.65mm/px · z∈[-71,+81]mm · 4 of 48 slices shown]
[im 1/48]
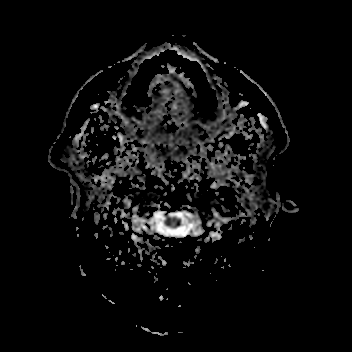
[im 16/48]
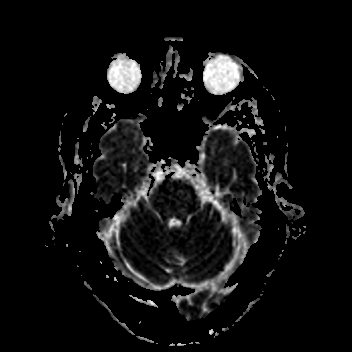
[im 32/48]
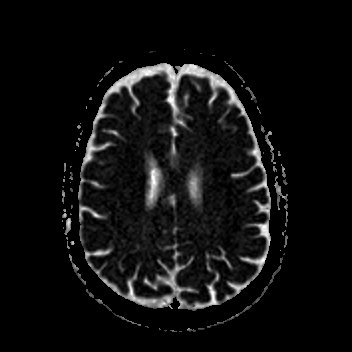
[im 48/48]
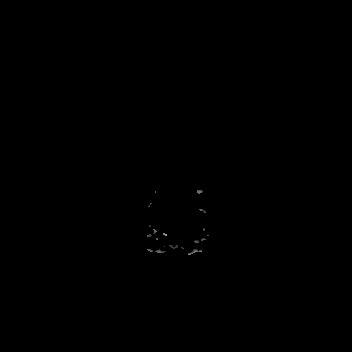

[Series 7: cor dwi_tracew · coronal · 5.0mm · 0.60mm/px · 3 of 38 slices shown]
[im 1/38]
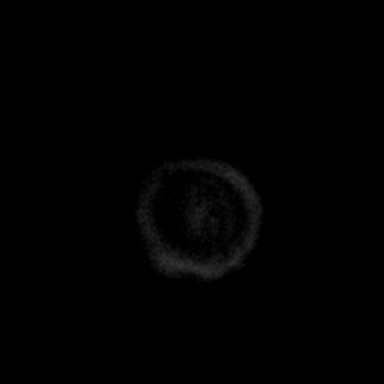
[im 19/38]
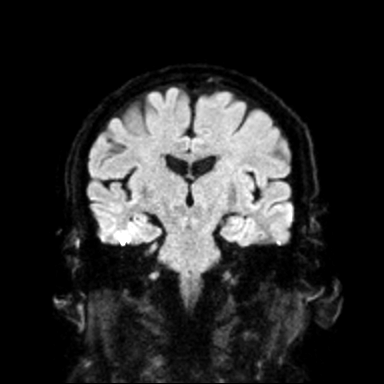
[im 38/38]
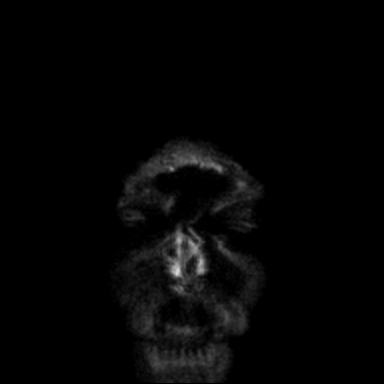

[Series 8: cor dwi_adc · coronal · 5.0mm · 0.60mm/px · 3 of 38 slices shown]
[im 1/38]
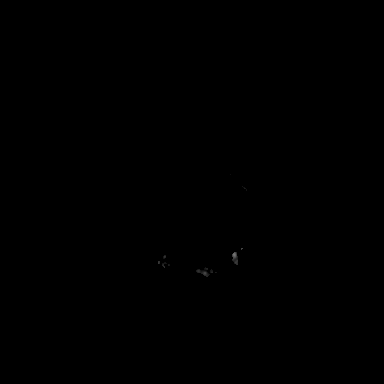
[im 19/38]
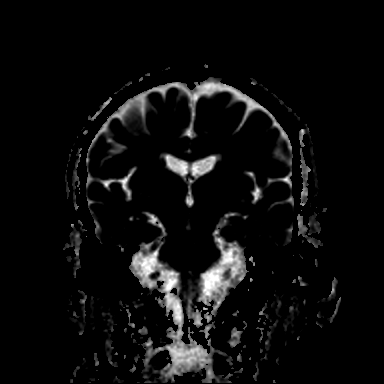
[im 38/38]
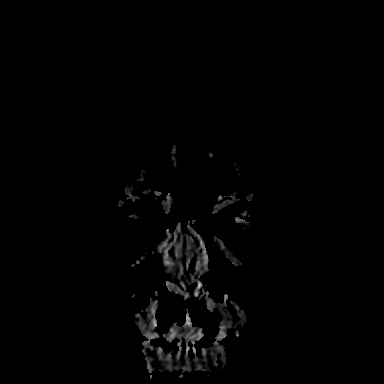

[Series 9: T1 · sagittal · 5.0mm · 0.62mm/px · 2 of 21 slices shown (1 of 2)]
[im 1/21]
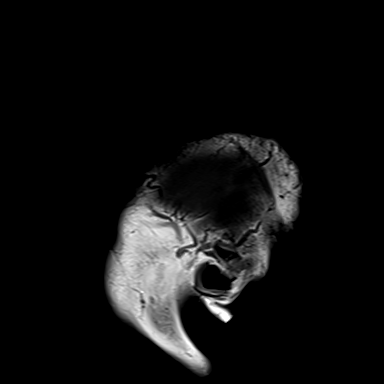
[im 21/21]
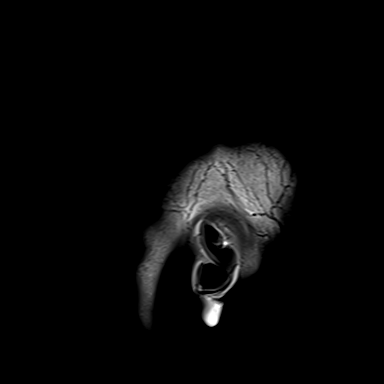

[Series 10: T2 · axial · 5.0mm · 0.45mm/px · z∈[-71,+81]mm · 2 of 27 slices shown (1 of 2)]
[im 1/27]
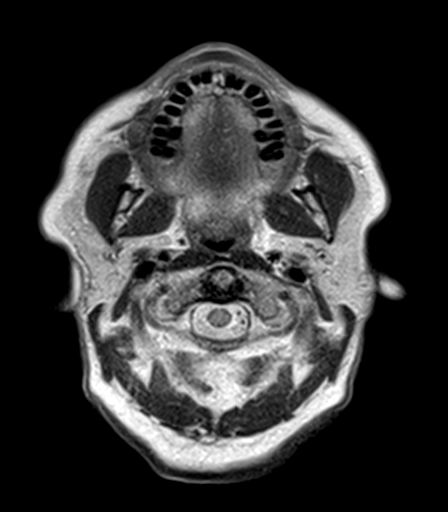
[im 27/27]
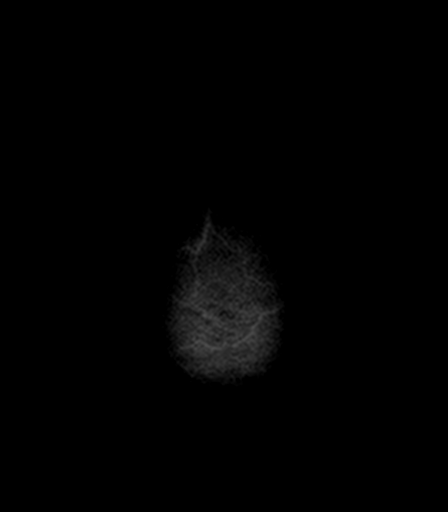

[Series 12: pha_images · axial · 3.0mm · 0.90mm/px · z∈[-75,+86]mm · 5 of 56 slices shown]
[im 1/56]
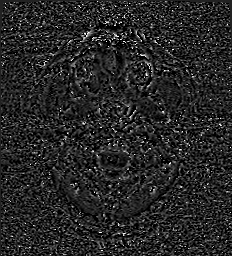
[im 14/56]
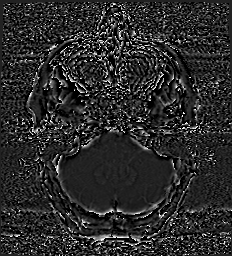
[im 28/56]
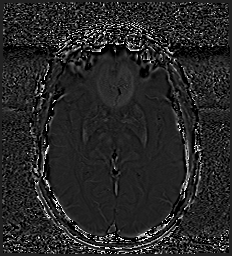
[im 42/56]
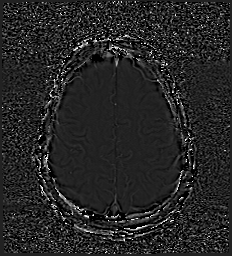
[im 56/56]
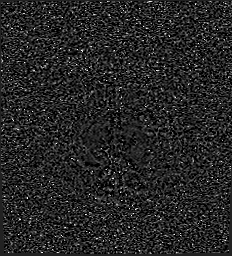

[Series 13: swi_images · axial · 3.0mm · 0.90mm/px · 1 of 56 slices shown]
[im 1/56]
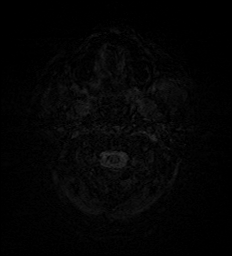

[Series 15: FLAIR · axial · 3.0mm · 0.53mm/px · z∈[-75,+83]mm · 5 of 55 slices shown]
[im 1/55]
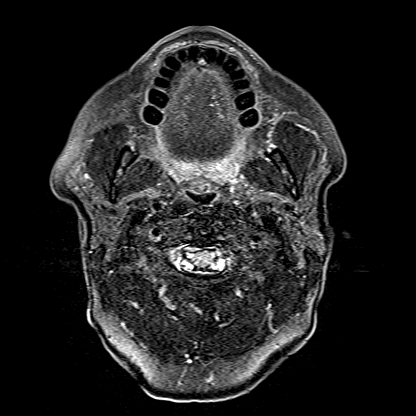
[im 14/55]
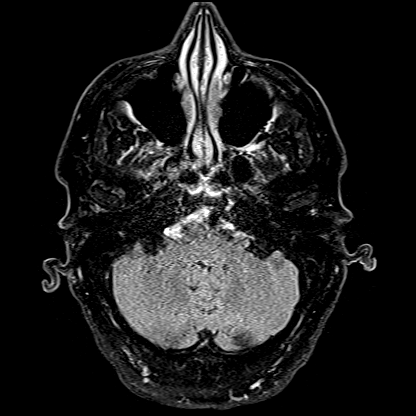
[im 28/55]
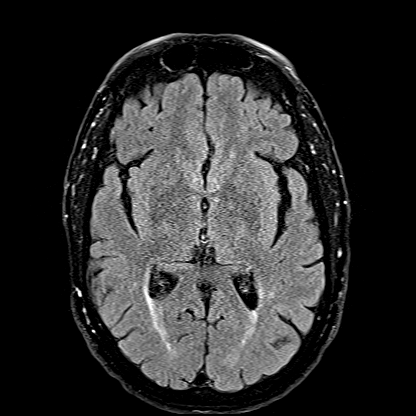
[im 41/55]
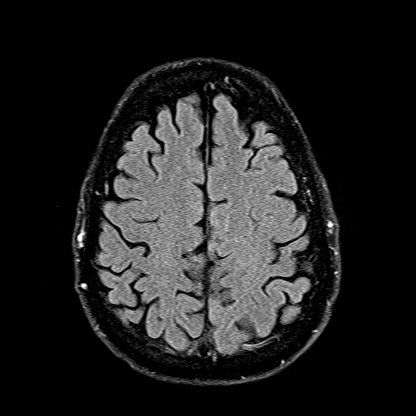
[im 55/55]
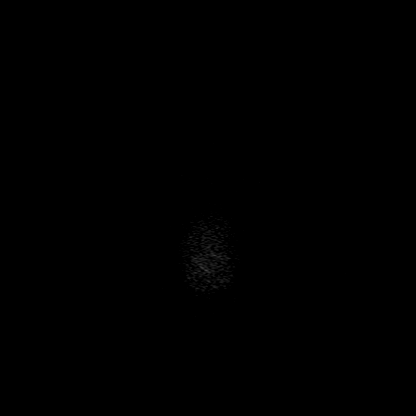

[Series 16: T1 · axial · 1.0mm · 0.98mm/px · z∈[-71,+85]mm · 8 of 160 slices shown (2 of 2)]
[im 1/160]
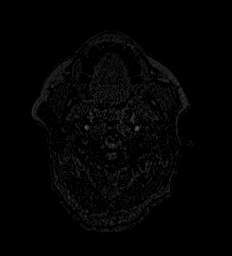
[im 27/160]
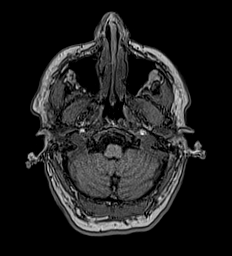
[im 54/160]
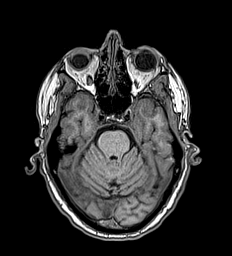
[im 67/160]
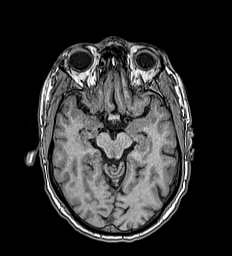
[im 93/160]
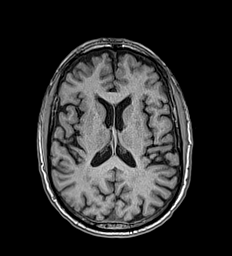
[im 107/160]
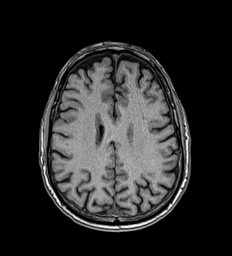
[im 133/160]
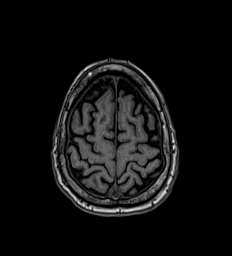
[im 160/160]
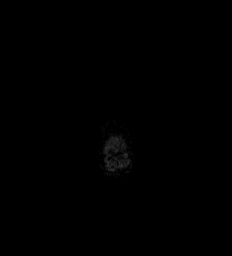

[Series 17: T2 · coronal · 5.0mm · 0.45mm/px · 3 of 31 slices shown (2 of 2)]
[im 1/31]
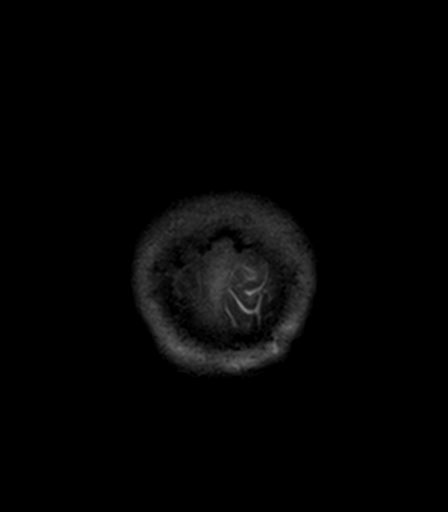
[im 16/31]
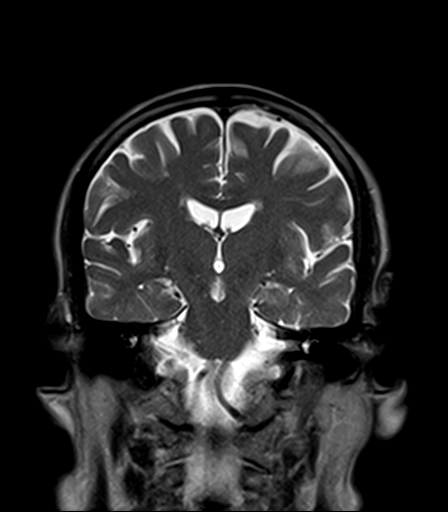
[im 31/31]
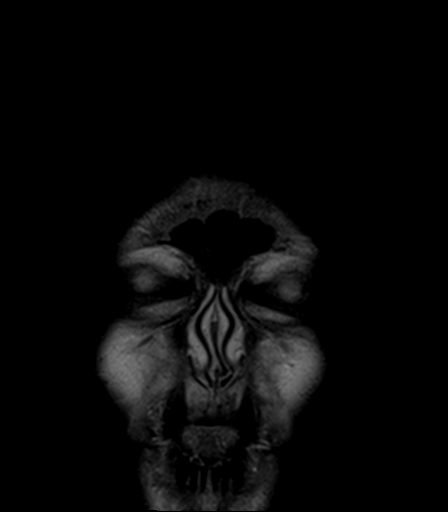

[39 of 48 positions shown; findings below may reference images not displayed]

FINDINGS: Brain: Cerebral volume within normal limits for age. Mild T2/FLAIR
hyperintensity seen involving the periventricular and deep white
matter both cerebral hemispheres, most like related chronic
microvascular ischemic disease, mild for age.

No abnormal foci of restricted diffusion to suggest acute or
subacute ischemia. Gray-white matter differentiation maintained. No
encephalomalacia to suggest chronic cortical infarction. No evidence
for acute or chronic intracranial hemorrhage.

No mass lesion, midline shift or mass effect. No hydrocephalus or
extra-axial fluid collection. Pituitary gland suprasellar region
within normal limits. Midline structures intact.

Vascular: Major intracranial vascular flow voids are maintained.

Skull and upper cervical spine: Craniocervical junction within
normal limits. Bone marrow signal intensity normal. No scalp soft
tissue abnormality.

Sinuses/Orbits: Patient status post bilateral ocular lens
replacement. Globes and orbital soft tissues demonstrate no acute
finding. Paranasal sinuses are clear. No mastoid effusion.

Other: None.
IMPRESSION: 1. No acute intracranial abnormality.
2. Mild chronic microvascular ischemic disease for age.

## 2022-04-08 IMAGING — US US CAROTID DUPLEX BILAT
1 series · 14 of 24 positions shown · non-contrast
Comparison: 03/11/2021

CLINICAL DATA: Dizziness, TIA symptoms, hyperlipidemia, syncope

EXAM:
BILATERAL CAROTID DUPLEX ULTRASOUND
TECHNIQUE: Gray scale imaging, color Doppler and duplex ultrasound were
performed of bilateral carotid and vertebral arteries in the neck.

[Series 1: us carotid bilateral · 14 of 67 slices shown]
[im 1/67]
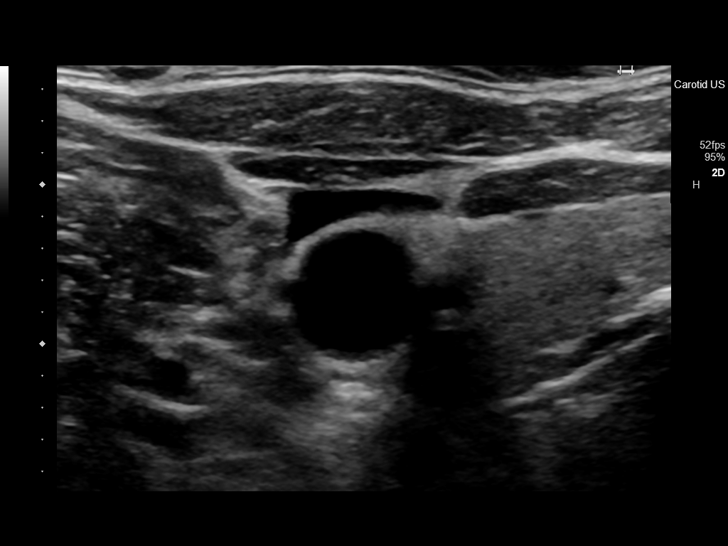
[im 6/67]
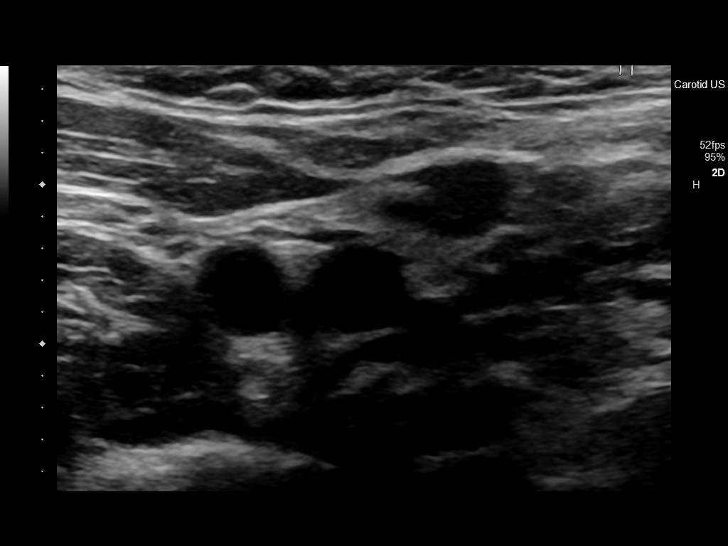
[im 12/67]
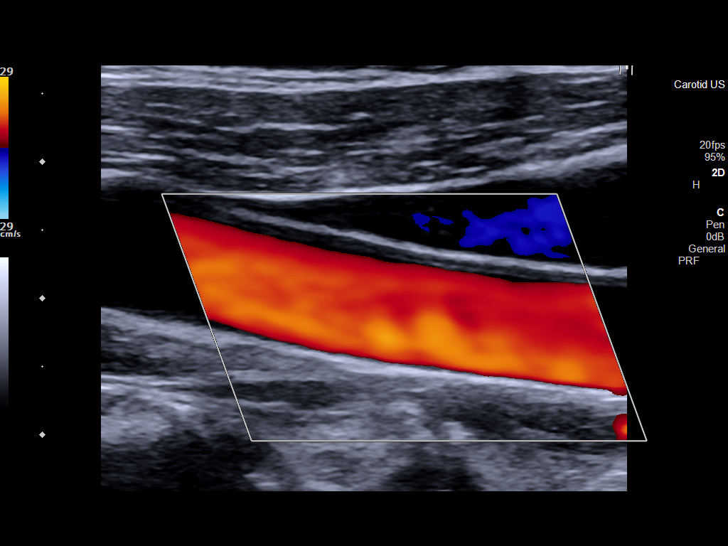
[im 18/67]
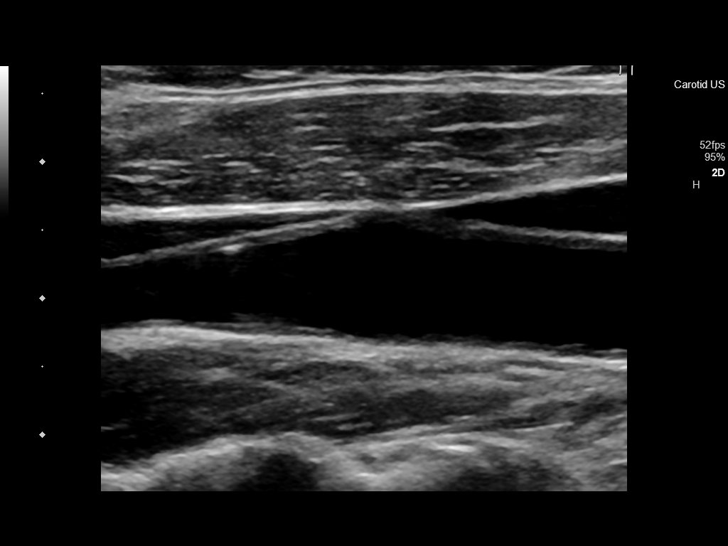
[im 21/67]
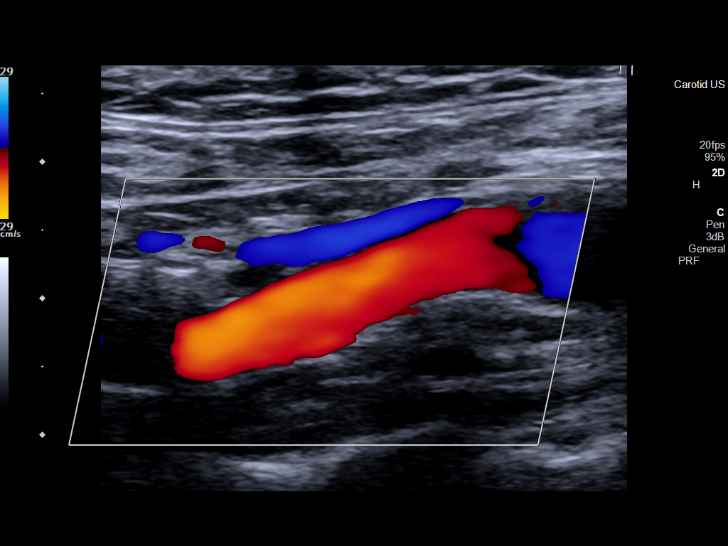
[im 26/67]
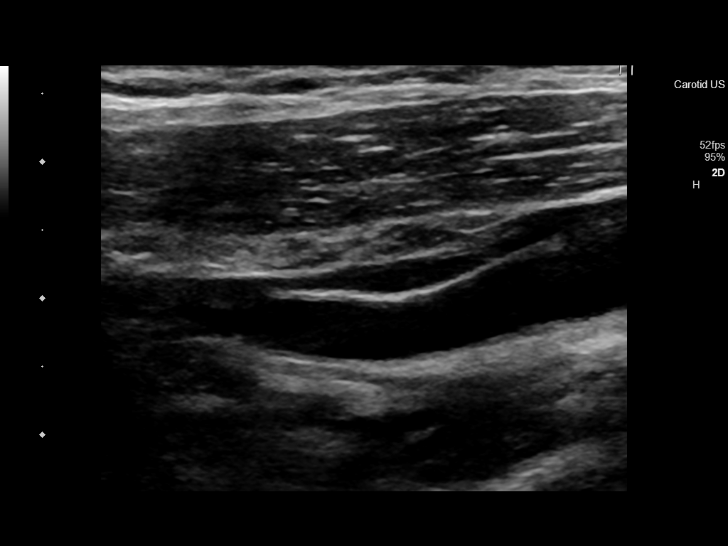
[im 32/67]
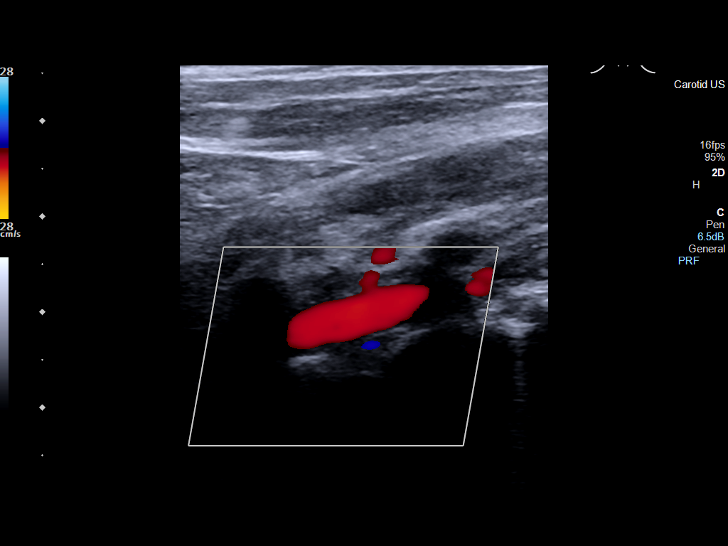
[im 35/67]
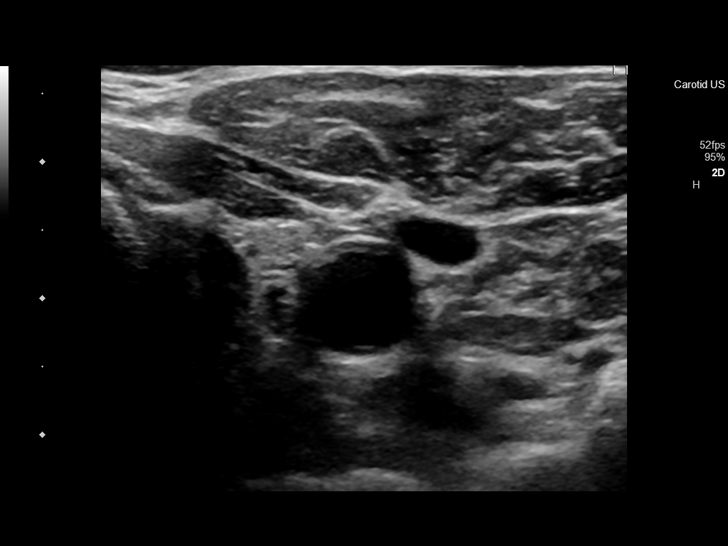
[im 41/67]
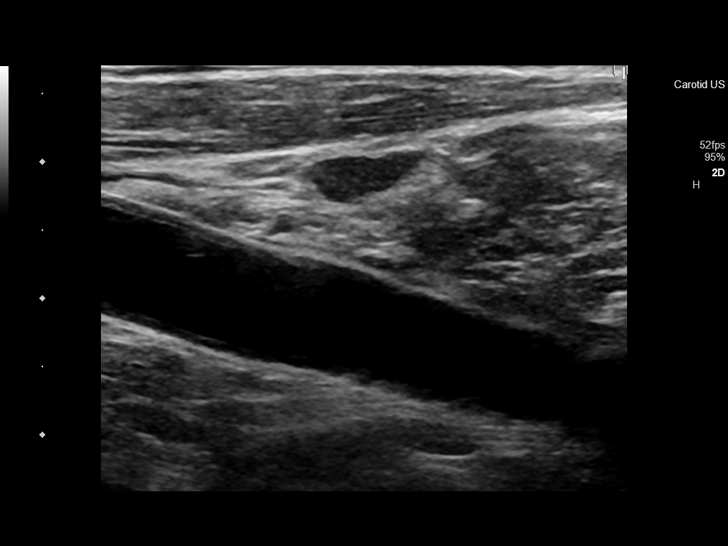
[im 46/67]
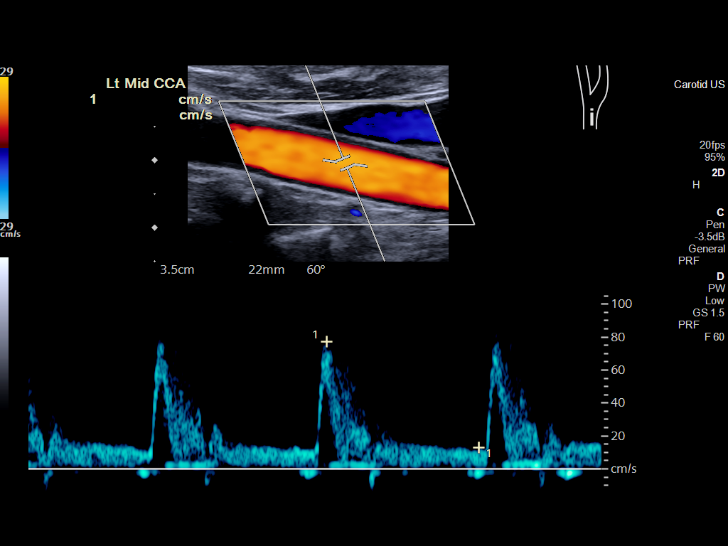
[im 52/67]
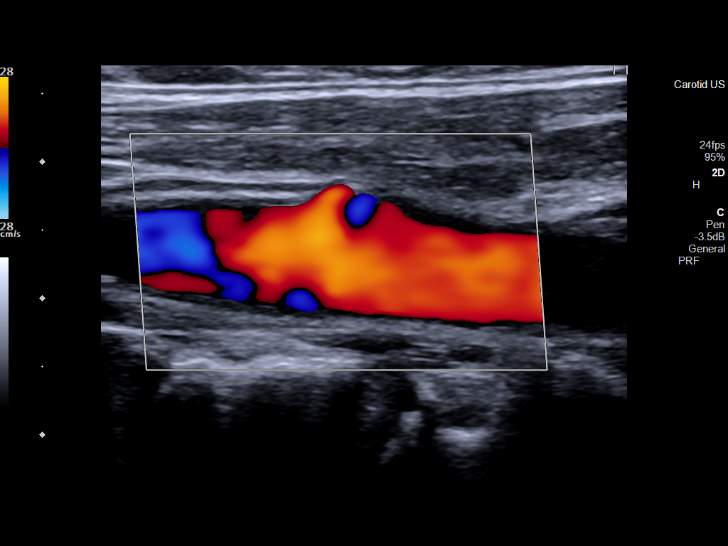
[im 55/67]
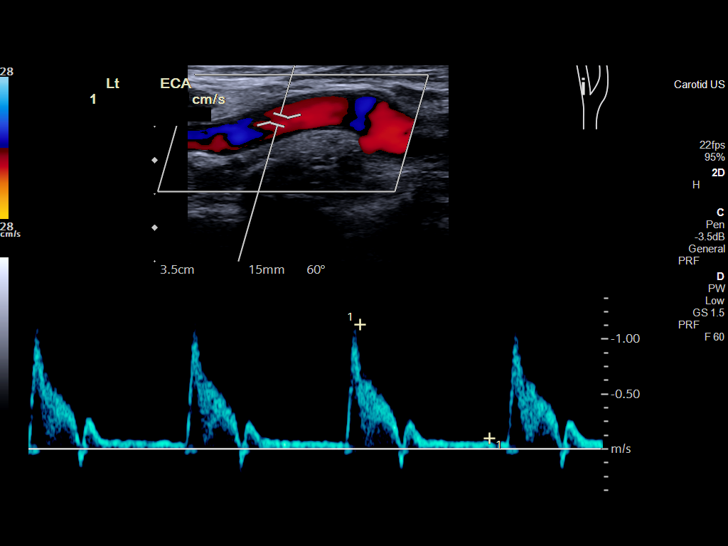
[im 61/67]
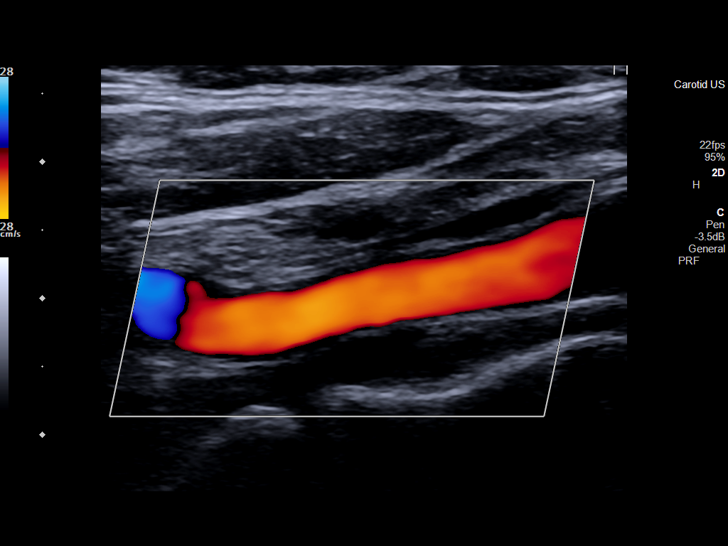
[im 67/67]
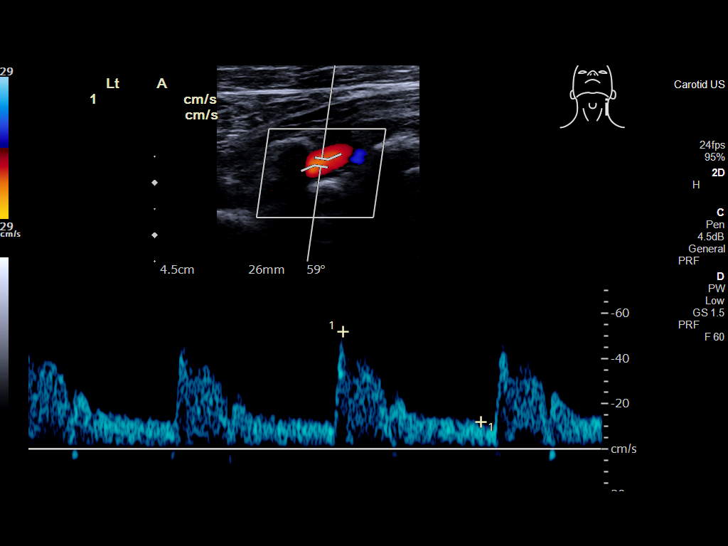

[14 of 24 positions shown; findings below may reference images not displayed]

FINDINGS: Criteria: Quantification of carotid stenosis is based on velocity
parameters that correlate the residual internal carotid diameter
with NASCET-based stenosis levels, using the diameter of the distal
internal carotid lumen as the denominator for stenosis measurement.

The following velocity measurements were obtained:

RIGHT

ICA: 88/25 cm/sec

CCA: 70/12 cm/sec

SYSTOLIC ICA/CCA RATIO:

ECA: 132 cm/sec

LEFT

ICA: 94/31 cm/sec

CCA: 84/16 cm/sec

SYSTOLIC ICA/CCA RATIO:

ECA: 113 cm/sec

RIGHT CAROTID ARTERY: No significant atheromatous plaque.

RIGHT VERTEBRAL ARTERY:  Antegrade

LEFT CAROTID ARTERY:  No significant atheromatous plaque.

LEFT VERTEBRAL ARTERY:  Antegrade
IMPRESSION: 1. No evidence of significant stenosis within either internal
carotid artery. Estimated 0-49% stenosis bilaterally.

## 2022-08-08 DIAGNOSIS — H61031 Chondritis of right external ear: Secondary | ICD-10-CM | POA: Diagnosis not present

## 2022-08-08 DIAGNOSIS — H8101 Meniere's disease, right ear: Secondary | ICD-10-CM | POA: Diagnosis not present

## 2022-09-11 ENCOUNTER — Ambulatory Visit: Payer: PPO | Admitting: Dermatology

## 2022-09-11 ENCOUNTER — Encounter: Payer: Self-pay | Admitting: Dermatology

## 2022-09-11 VITALS — BP 137/80 | HR 79

## 2022-09-11 DIAGNOSIS — L570 Actinic keratosis: Secondary | ICD-10-CM | POA: Diagnosis not present

## 2022-09-11 DIAGNOSIS — L820 Inflamed seborrheic keratosis: Secondary | ICD-10-CM | POA: Diagnosis not present

## 2022-09-11 DIAGNOSIS — H61001 Unspecified perichondritis of right external ear: Secondary | ICD-10-CM | POA: Diagnosis not present

## 2022-09-11 DIAGNOSIS — L578 Other skin changes due to chronic exposure to nonionizing radiation: Secondary | ICD-10-CM | POA: Diagnosis not present

## 2022-09-11 DIAGNOSIS — D2239 Melanocytic nevi of other parts of face: Secondary | ICD-10-CM

## 2022-09-11 DIAGNOSIS — D2362 Other benign neoplasm of skin of left upper limb, including shoulder: Secondary | ICD-10-CM | POA: Diagnosis not present

## 2022-09-11 DIAGNOSIS — D229 Melanocytic nevi, unspecified: Secondary | ICD-10-CM

## 2022-09-11 NOTE — Patient Instructions (Addendum)
Actinic keratoses are precancerous spots that appear secondary to cumulative UV radiation exposure/sun exposure over time. They are chronic with expected duration over 1 year. A portion of actinic keratoses will progress to squamous cell carcinoma of the skin. It is not possible to reliably predict which spots will progress to skin cancer and so treatment is recommended to prevent development of skin cancer.  Recommend daily broad spectrum sunscreen SPF 30+ to sun-exposed areas, reapply every 2 hours as needed.  Recommend staying in the shade or wearing long sleeves, sun glasses (UVA+UVB protection) and wide brim hats (4-inch brim around the entire circumference of the hat). Call for new or changing lesions.   Cryotherapy Aftercare  Wash gently with soap and water everyday.   Apply Vaseline and Band-Aid daily until healed.   Seborrheic Keratosis  What causes seborrheic keratoses? Seborrheic keratoses are harmless, common skin growths that first appear during adult life.  As time goes by, more growths appear.  Some people may develop a large number of them.  Seborrheic keratoses appear on both covered and uncovered body parts.  They are not caused by sunlight.  The tendency to develop seborrheic keratoses can be inherited.  They vary in color from skin-colored to gray, brown, or even black.  They can be either smooth or have a rough, warty surface.   Seborrheic keratoses are superficial and look as if they were stuck on the skin.  Under the microscope this type of keratosis looks like layers upon layers of skin.  That is why at times the top layer may seem to fall off, but the rest of the growth remains and re-grows.    Treatment Seborrheic keratoses do not need to be treated, but can easily be removed in the office.  Seborrheic keratoses often cause symptoms when they rub on clothing or jewelry.  Lesions can be in the way of shaving.  If they become inflamed, they can cause itching, soreness, or  burning.  Removal of a seborrheic keratosis can be accomplished by freezing, burning, or surgery. If any spot bleeds, scabs, or grows rapidly, please return to have it checked, as these can be an indication of a skin cancer.          Due to recent changes in healthcare laws, you may see results of your pathology and/or laboratory studies on MyChart before the doctors have had a chance to review them. We understand that in some cases there may be results that are confusing or concerning to you. Please understand that not all results are received at the same time and often the doctors may need to interpret multiple results in order to provide you with the best plan of care or course of treatment. Therefore, we ask that you please give us 2 business days to thoroughly review all your results before contacting the office for clarification. Should we see a critical lab result, you will be contacted sooner.   If You Need Anything After Your Visit  If you have any questions or concerns for your doctor, please call our main line at [phone] and press option 4 to reach your doctor's medical assistant. If no one answers, please leave a voicemail as directed and we will return your call as soon as possible. Messages left after 4 pm will be answered the following business day.   You may also send us a message via MyChart. We typically respond to MyChart messages within 1-2 business days.  For prescription refills, please ask your pharmacy   to contact our office. Our fax number is [phone].  If you have an urgent issue when the clinic is closed that cannot wait until the next business day, you can page your doctor at the number below.    Please note that while we do our best to be available for urgent issues outside of office hours, we are not available 24/7.   If you have an urgent issue and are unable to reach us, you may choose to seek medical care at your doctor's office, retail clinic,  urgent care center, or emergency room.  If you have a medical emergency, please immediately call 911 or go to the emergency department.  Pager Numbers  - Dr. Kowalski: [phone]  - Dr. Moye: [phone]  - Dr. Stewart: [phone]  In the event of inclement weather, please call our main line at [phone] for an update on the status of any delays or closures.  Dermatology Medication Tips: Please keep the boxes that topical medications come in in order to help keep track of the instructions about where and how to use these. Pharmacies typically print the medication instructions only on the boxes and not directly on the medication tubes.   If your medication is too expensive, please contact our office at [phone] option 4 or send us a message through MyChart.   We are unable to tell what your co-pay for medications will be in advance as this is different depending on your insurance coverage. However, we may be able to find a substitute medication at lower cost or fill out paperwork to get insurance to cover a needed medication.   If a prior authorization is required to get your medication covered by your insurance company, please allow us 1-2 business days to complete this process.  Drug prices often vary depending on where the prescription is filled and some pharmacies may offer cheaper prices.  The website www.goodrx.com contains coupons for medications through different pharmacies. The prices here do not account for what the cost may be with help from insurance (it may be cheaper with your insurance), but the website can give you the price if you did not use any insurance.  - You can print the associated coupon and take it with your prescription to the pharmacy.  - You may also stop by our office during regular business hours and pick up a GoodRx coupon card.  - If you need your prescription sent electronically to a different pharmacy, notify our office through [facility]  MyChart or by phone at [phone] option 4.     Si Usted Necesita Algo Despus de Su Visita  Tambin puede enviarnos un mensaje a travs de MyChart. Por lo general respondemos a los mensajes de MyChart en el transcurso de 1 a 2 das hbiles.  Para renovar recetas, por favor pida a su farmacia que se ponga en contacto con nuestra oficina. Nuestro nmero de fax es el [phone].  Si tiene un asunto urgente cuando la clnica est cerrada y que no puede esperar hasta el siguiente da hbil, puede llamar/localizar a su doctor(a) al nmero que aparece a continuacin.   Por favor, tenga en cuenta que aunque hacemos todo lo posible para estar disponibles para asuntos urgentes fuera del horario de oficina, no estamos disponibles las 24 horas del da, los 7 das de la semana.   Si tiene un problema urgente y no puede comunicarse con nosotros, puede optar por buscar atencin mdica  en el consultorio de su doctor(a), en una   clnica privada, en un centro de atencin urgente o en una sala de emergencias.  Si tiene una emergencia mdica, por favor llame inmediatamente al 911 o vaya a la sala de emergencias.  Nmeros de bper  - Dr. Kowalski: [phone]  - Dra. Moye: [phone]  - Dra. Stewart: [phone]  En caso de inclemencias del tiempo, por favor llame a nuestra lnea principal al [phone] para una actualizacin sobre el estado de cualquier retraso o cierre.  Consejos para la medicacin en dermatologa: Por favor, guarde las cajas en las que vienen los medicamentos de uso tpico para ayudarle a seguir las instrucciones sobre dnde y cmo usarlos. Las farmacias generalmente imprimen las instrucciones del medicamento slo en las cajas y no directamente en los tubos del medicamento.   Si su medicamento es muy caro, por favor, pngase en contacto con nuestra oficina llamando al [phone] y presione la opcin 4 o envenos un mensaje a travs de MyChart.   No podemos decirle cul  ser su copago por los medicamentos por adelantado ya que esto es diferente dependiendo de la cobertura de su seguro. Sin embargo, es posible que podamos encontrar un medicamento sustituto a menor costo o llenar un formulario para que el seguro cubra el medicamento que se considera necesario.   Si se requiere una autorizacin previa para que su compaa de seguros cubra su medicamento, por favor permtanos de 1 a 2 das hbiles para completar este proceso.  Los precios de los medicamentos varan con frecuencia dependiendo del lugar de dnde se surte la receta y alguna farmacias pueden ofrecer precios ms baratos.  El sitio web www.goodrx.com tiene cupones para medicamentos de diferentes farmacias. Los precios aqu no tienen en cuenta lo que podra costar con la ayuda del seguro (puede ser ms barato con su seguro), pero el sitio web puede darle el precio si no utiliz ningn seguro.  - Puede imprimir el cupn correspondiente y llevarlo con su receta a la farmacia.  - Tambin puede pasar por nuestra oficina durante el horario de atencin regular y recoger una tarjeta de cupones de GoodRx.  - Si necesita que su receta se enve electrnicamente a una farmacia diferente, informe a nuestra oficina a travs de MyChart de [facility] o por telfono llamando al [phone] y presione la opcin 4.  

## 2022-09-11 NOTE — Progress Notes (Signed)
Follow-Up Visit   Subjective  Darin Bell is a 76 y.o. male who presents for the following: Actinic Keratosis (6 month ak follow up. Concerned with spots at left forehead, left cheek , behind right ear, right forearm and left upper arm ).  Spots are sore and don't clear  The patient has spots, moles and lesions to be evaluated, some may be new or changing and the patient has concerns that these could be cancer.  The following portions of the chart were reviewed this encounter and updated as appropriate:      Review of Systems: No other skin or systemic complaints except as noted in HPI or Assessment and Plan.   Objective  Well appearing patient in no apparent distress; mood and affect are within normal limits.  A focused examination was performed including left cheek, left forehead, right forehead, scalp, b/l forearms, left ear, right ear, right posterior ear. Relevant physical exam findings are noted in the Assessment and Plan.  right upper temple x 1, right upper ear helix x 1, left upper arm x 1 (3) Erythematous thin papules/macules with gritty scale.   left cheek x 1 Erythematous stuck-on, waxy papule  right upper ear helix x 1 5 mm firm flesh white papule   Left Malar Cheek 4 mm flesh firm papule    Assessment & Plan  Actinic keratosis (3) right upper temple x 1, right upper ear helix x 1, left upper arm x 1  Actinic keratoses are precancerous spots that appear secondary to cumulative UV radiation exposure/sun exposure over time. They are chronic with expected duration over 1 year. A portion of actinic keratoses will progress to squamous cell carcinoma of the skin. It is not possible to reliably predict which spots will progress to skin cancer and so treatment is recommended to prevent development of skin cancer.  Recommend daily broad spectrum sunscreen SPF 30+ to sun-exposed areas, reapply every 2 hours as needed.  Recommend staying in the shade or wearing long  sleeves, sun glasses (UVA+UVB protection) and wide brim hats (4-inch brim around the entire circumference of the hat). Call for new or changing lesions.  Destruction of lesion - right upper temple x 1, right upper ear helix x 1, left upper arm x 1  Destruction method: cryotherapy   Informed consent: discussed and consent obtained   Lesion destroyed using liquid nitrogen: Yes   Region frozen until ice ball extended beyond lesion: Yes   Outcome: patient tolerated procedure well with no complications   Post-procedure details: wound care instructions given   Additional details:  Prior to procedure, discussed risks of blister formation, small wound, skin dyspigmentation, or rare scar following cryotherapy. Recommend Vaseline ointment to treated areas while healing.   Inflamed seborrheic keratosis left cheek x 1  Symptomatic, irritating, patient would like treated.  Destruction of lesion - left cheek x 1  Destruction method: cryotherapy   Informed consent: discussed and consent obtained   Lesion destroyed using liquid nitrogen: Yes   Region frozen until ice ball extended beyond lesion: Yes   Outcome: patient tolerated procedure well with no complications   Post-procedure details: wound care instructions given   Additional details:  Prior to procedure, discussed risks of blister formation, small wound, skin dyspigmentation, or rare scar following cryotherapy. Recommend Vaseline ointment to treated areas while healing.   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 Asymptomatic and Stable. Observe.   Nevus Left Malar Cheek  Benign-appearing. Stable compared to previous visit. Observation.  Call clinic for new or changing moles.  Recommend daily use of broad spectrum spf 30+ sunscreen to sun-exposed areas.     Seborrheic Keratoses - Stuck-on, waxy, tan-brown papules and/or plaques  - Benign-appearing - Discussed benign etiology and  prognosis. - Observe - Call for any changes  Dermatofibroma Left elbow 6 mm firm pink nodule  - Firm pink/brown papulenodule with dimple sign - Benign appearing - Call for any changes - recheck on f/up  Actinic Damage - chronic, secondary to cumulative UV radiation exposure/sun exposure over time - diffuse scaly erythematous macules with underlying dyspigmentation - Recommend daily broad spectrum sunscreen SPF 30+ to sun-exposed areas, reapply every 2 hours as needed.  - Recommend staying in the shade or wearing long sleeves, sun glasses (UVA+UVB protection) and wide brim hats (4-inch brim around the entire circumference of the hat). - Call for new or changing lesions.  Return in about 6 months (around 03/12/2023) for upper body exam .  I, Ruthell Rummage, CMA, am acting as scribe for Brendolyn Patty, MD.  Documentation: I have reviewed the above documentation for accuracy and completeness, and I agree with the above.  Brendolyn Patty MD

## 2022-09-18 ENCOUNTER — Telehealth: Payer: Self-pay

## 2022-09-18 NOTE — Telephone Encounter (Signed)
When pt was in for last f/u on 09/11/22 he had mentioned he had used a cream from the ENT for the spot on his right ear (hx of CNCH on the R ear).  Pt could not remember the name of the cream.  Pt returned to clinic and told the front to advise Dr. Nicole Kindred the name of the cream.  I advised pt not to use the Clobetasol on face, under arms or in groin./sh

## 2022-09-27 DIAGNOSIS — T1501XA Foreign body in cornea, right eye, initial encounter: Secondary | ICD-10-CM | POA: Diagnosis not present

## 2023-01-24 DIAGNOSIS — R3911 Hesitancy of micturition: Secondary | ICD-10-CM | POA: Diagnosis not present

## 2023-01-24 DIAGNOSIS — R748 Abnormal levels of other serum enzymes: Secondary | ICD-10-CM | POA: Diagnosis not present

## 2023-01-24 DIAGNOSIS — Z0000 Encounter for general adult medical examination without abnormal findings: Secondary | ICD-10-CM | POA: Diagnosis not present

## 2023-01-24 DIAGNOSIS — E7800 Pure hypercholesterolemia, unspecified: Secondary | ICD-10-CM | POA: Diagnosis not present

## 2023-01-24 DIAGNOSIS — Z125 Encounter for screening for malignant neoplasm of prostate: Secondary | ICD-10-CM | POA: Diagnosis not present

## 2023-01-24 DIAGNOSIS — N401 Enlarged prostate with lower urinary tract symptoms: Secondary | ICD-10-CM | POA: Diagnosis not present

## 2023-01-24 DIAGNOSIS — Z8739 Personal history of other diseases of the musculoskeletal system and connective tissue: Secondary | ICD-10-CM | POA: Diagnosis not present

## 2023-01-31 DIAGNOSIS — Z85038 Personal history of other malignant neoplasm of large intestine: Secondary | ICD-10-CM | POA: Diagnosis not present

## 2023-01-31 DIAGNOSIS — E7800 Pure hypercholesterolemia, unspecified: Secondary | ICD-10-CM | POA: Diagnosis not present

## 2023-01-31 DIAGNOSIS — Z8739 Personal history of other diseases of the musculoskeletal system and connective tissue: Secondary | ICD-10-CM | POA: Diagnosis not present

## 2023-01-31 DIAGNOSIS — R3911 Hesitancy of micturition: Secondary | ICD-10-CM | POA: Diagnosis not present

## 2023-01-31 DIAGNOSIS — H8109 Meniere's disease, unspecified ear: Secondary | ICD-10-CM | POA: Insufficient documentation

## 2023-01-31 DIAGNOSIS — N401 Enlarged prostate with lower urinary tract symptoms: Secondary | ICD-10-CM | POA: Diagnosis not present

## 2023-01-31 DIAGNOSIS — Z125 Encounter for screening for malignant neoplasm of prostate: Secondary | ICD-10-CM | POA: Diagnosis not present

## 2023-01-31 DIAGNOSIS — Z0000 Encounter for general adult medical examination without abnormal findings: Secondary | ICD-10-CM | POA: Diagnosis not present

## 2023-01-31 HISTORY — DX: Meniere's disease, unspecified ear: H81.09

## 2023-02-04 ENCOUNTER — Other Ambulatory Visit: Payer: Self-pay | Admitting: Internal Medicine

## 2023-02-04 DIAGNOSIS — E7800 Pure hypercholesterolemia, unspecified: Secondary | ICD-10-CM

## 2023-02-08 ENCOUNTER — Ambulatory Visit
Admission: RE | Admit: 2023-02-08 | Discharge: 2023-02-08 | Disposition: A | Discharge status: Home / Self Care | Payer: PPO | Source: Ambulatory Visit | Attending: Internal Medicine | Admitting: Internal Medicine

## 2023-02-08 DIAGNOSIS — E7800 Pure hypercholesterolemia, unspecified: Secondary | ICD-10-CM

## 2023-02-12 DIAGNOSIS — I2510 Atherosclerotic heart disease of native coronary artery without angina pectoris: Secondary | ICD-10-CM | POA: Insufficient documentation

## 2023-02-12 HISTORY — DX: Atherosclerotic heart disease of native coronary artery without angina pectoris: I25.10

## 2023-02-20 DIAGNOSIS — R931 Abnormal findings on diagnostic imaging of heart and coronary circulation: Secondary | ICD-10-CM | POA: Diagnosis not present

## 2023-02-22 DIAGNOSIS — T1501XA Foreign body in cornea, right eye, initial encounter: Secondary | ICD-10-CM | POA: Diagnosis not present

## 2023-03-12 ENCOUNTER — Ambulatory Visit: Payer: PPO | Admitting: Dermatology

## 2023-07-09 ENCOUNTER — Ambulatory Visit: Payer: PPO | Admitting: Dermatology

## 2023-07-09 VITALS — BP 146/79 | HR 73

## 2023-07-09 DIAGNOSIS — D492 Neoplasm of unspecified behavior of bone, soft tissue, and skin: Secondary | ICD-10-CM

## 2023-07-09 DIAGNOSIS — L814 Other melanin hyperpigmentation: Secondary | ICD-10-CM

## 2023-07-09 DIAGNOSIS — L578 Other skin changes due to chronic exposure to nonionizing radiation: Secondary | ICD-10-CM

## 2023-07-09 DIAGNOSIS — W908XXA Exposure to other nonionizing radiation, initial encounter: Secondary | ICD-10-CM

## 2023-07-09 DIAGNOSIS — D2239 Melanocytic nevi of other parts of face: Secondary | ICD-10-CM | POA: Diagnosis not present

## 2023-07-09 DIAGNOSIS — Z872 Personal history of diseases of the skin and subcutaneous tissue: Secondary | ICD-10-CM

## 2023-07-09 DIAGNOSIS — Z85828 Personal history of other malignant neoplasm of skin: Secondary | ICD-10-CM | POA: Diagnosis not present

## 2023-07-09 DIAGNOSIS — D229 Melanocytic nevi, unspecified: Secondary | ICD-10-CM

## 2023-07-09 DIAGNOSIS — L821 Other seborrheic keratosis: Secondary | ICD-10-CM | POA: Diagnosis not present

## 2023-07-09 DIAGNOSIS — L570 Actinic keratosis: Secondary | ICD-10-CM | POA: Diagnosis not present

## 2023-07-09 DIAGNOSIS — Z1283 Encounter for screening for malignant neoplasm of skin: Secondary | ICD-10-CM

## 2023-07-09 DIAGNOSIS — D1801 Hemangioma of skin and subcutaneous tissue: Secondary | ICD-10-CM

## 2023-07-09 NOTE — Patient Instructions (Signed)

## 2023-07-09 NOTE — Progress Notes (Signed)
Follow-Up Visit   Subjective  Darin Bell is a 77 y.o. male who presents for the following: Skin Cancer Screening and Full Body Skin Exam. HxSCC, Hx of actinic keratoses.  The patient presents for Total-Body Skin Exam (TBSE) for skin cancer screening and mole check. The patient has spots, moles and lesions to be evaluated, some may be new or changing and the patient may have concern these could be cancer.    The following portions of the chart were reviewed this encounter and updated as appropriate: medications, allergies, medical history  Review of Systems:  No other skin or systemic complaints except as noted in HPI or Assessment and Plan.  Objective  Well appearing patient in no apparent distress; mood and affect are within normal limits.  A full examination was performed including scalp, head, eyes, ears, nose, lips, neck, chest, axillae, abdomen, back, buttocks, bilateral upper extremities, bilateral lower extremities, hands, feet, fingers, toes, fingernails, and toenails. All findings within normal limits unless otherwise noted below.   Relevant physical exam findings are noted in the Assessment and Plan.  Right Shoulder - Posterior 5 mm pearly pink papule       L hand x3, right wrist x1, sternal notch x1 (5) Hyperkeratotic erythematous papules    Assessment & Plan   HISTORY OF SQUAMOUS CELL CARCINOMA OF THE SKIN. Left forearm. Excised 01/23/2010. - No evidence of recurrence today - Recommend regular full body skin exams - Recommend daily broad spectrum sunscreen SPF 30+ to sun-exposed areas, reapply every 2 hours as needed.  - Call if any new or changing lesions are noted between office visits    SKIN CANCER SCREENING PERFORMED TODAY.  ACTINIC DAMAGE - Chronic condition, secondary to cumulative UV/sun exposure - diffuse scaly erythematous macules with underlying dyspigmentation - Recommend daily broad spectrum sunscreen SPF 30+ to sun-exposed areas, reapply  every 2 hours as needed.  - Staying in the shade or wearing long sleeves, sun glasses (UVA+UVB protection) and wide brim hats (4-inch brim around the entire circumference of the hat) are also recommended for sun protection.  - Call for new or changing lesions.  LENTIGINES, SEBORRHEIC KERATOSES, HEMANGIOMAS - Benign normal skin lesions - Benign-appearing - Call for any changes  MELANOCYTIC NEVI - Tan-brown and/or pink-flesh-colored symmetric macules and papules - Benign appearing on exam today - Observation - Call clinic for new or changing moles - Recommend daily use of broad spectrum spf 30+ sunscreen to sun-exposed areas.   NEVUS Exam: 4 mm flesh firm papule at left malar cheek  Treatment Plan:  Benign-appearing. Stable compared to previous visit. Observation.  Call clinic for new or changing moles.  Recommend daily use of broad spectrum spf 30+ sunscreen to sun-exposed areas.    Neoplasm of skin Right Shoulder - Posterior  Possible BCC. Strongly recommend shave biopsy and EDC today. Patient defers. He prefers to wait and recheck in 6 months.   Hypertrophic actinic keratosis (5) L hand x3, right wrist x1, sternal notch x1  Actinic keratoses are precancerous spots that appear secondary to cumulative UV radiation exposure/sun exposure over time. They are chronic with expected duration over 1 year. A portion of actinic keratoses will progress to squamous cell carcinoma of the skin. It is not possible to reliably predict which spots will progress to skin cancer and so treatment is recommended to prevent development of skin cancer.  Recommend daily broad spectrum sunscreen SPF 30+ to sun-exposed areas, reapply every 2 hours as needed.  Recommend staying in  the shade or wearing long sleeves, sun glasses (UVA+UVB protection) and wide brim hats (4-inch brim around the entire circumference of the hat). Call for new or changing lesions.  Destruction of lesion - L hand x3, right wrist x1,  sternal notch x1 (5)  Destruction method: cryotherapy   Informed consent: discussed and consent obtained   Lesion destroyed using liquid nitrogen: Yes   Region frozen until ice ball extended beyond lesion: Yes   Outcome: patient tolerated procedure well with no complications   Post-procedure details: wound care instructions given   Additional details:  Prior to procedure, discussed risks of blister formation, small wound, skin dyspigmentation, or rare scar following cryotherapy. Recommend Vaseline ointment to treated areas while healing.   History of SCC (squamous cell carcinoma) of skin  Skin cancer screening  Actinic skin damage  Lentigo  Seborrheic keratosis  Hemangioma of skin  Nevus   Return in about 6 months (around 01/06/2024) for UBSE, AK Follow Up, recheck shoulder.  I, Lawson Radar, CMA, am acting as scribe for Willeen Niece, MD.   Documentation: I have reviewed the above documentation for accuracy and completeness, and I agree with the above.  Willeen Niece, MD

## 2023-08-08 DIAGNOSIS — H8101 Meniere's disease, right ear: Secondary | ICD-10-CM | POA: Diagnosis not present

## 2023-08-08 DIAGNOSIS — H6123 Impacted cerumen, bilateral: Secondary | ICD-10-CM | POA: Diagnosis not present

## 2023-08-08 DIAGNOSIS — R42 Dizziness and giddiness: Secondary | ICD-10-CM | POA: Diagnosis not present

## 2023-09-30 DIAGNOSIS — Z961 Presence of intraocular lens: Secondary | ICD-10-CM | POA: Diagnosis not present

## 2023-09-30 DIAGNOSIS — H43813 Vitreous degeneration, bilateral: Secondary | ICD-10-CM | POA: Diagnosis not present

## 2023-11-13 ENCOUNTER — Telehealth: Payer: Self-pay | Admitting: *Deleted

## 2023-11-13 NOTE — Telephone Encounter (Signed)
Call was transferred to my phone and  voicemail from patient's daughter Harvin Hazel on [place]) was left regarding schedule colonoscopy.  However, per Michelle's comments in referral  Due to Age and Has Never Been Seen-Office Visit Required Prior to Scheduling. Referral Deferred to office visits.   Please call patient and schedule an office visit, he is 78 years old which is over the cut off age limit.

## 2023-11-14 NOTE — Telephone Encounter (Signed)
Patient's daughter was transferred to my phone again. Inform that I send message already about patient.  Please call patient's daughter Harvin Hazel) to schedule patient's new patient appointment with a provider due to he is 78 years old.

## 2023-11-14 NOTE — Telephone Encounter (Signed)
I tried to contact the person he didn't answer on 11/11/23 around 11:30 but I went through the referral because [place] sent me the message personally.

## 2023-11-15 NOTE — Telephone Encounter (Signed)
He is schedule for 11/21/2023 at 11:15 am.

## 2023-11-15 NOTE — Telephone Encounter (Signed)
I called the patient daughter and there was no answer I left a voicemail for her to call us back.

## 2023-11-19 ENCOUNTER — Other Ambulatory Visit: Payer: Self-pay

## 2023-11-20 NOTE — Progress Notes (Signed)
 Ellouise Console, PA-C [address]  [address]  [place], [place] [address]  Main: [phone]  Fax: [phone]   Gastroenterology Consultation  Referring Provider:     Fernande Ophelia JINNY DOUGLAS, MD Primary Care Physician:  Fernande Ophelia JINNY DOUGLAS, MD Primary Gastroenterologist:  Ellouise Console, PA-C / Dr. Rogelia Copping   Reason for Consultation:     History of colon cancer; Repeat Colonoscopy        HPI:   Darin Bell is a 78 y.o. y/o male referred for consultation & management  by Fernande Ophelia JINNY DOUGLAS, MD.  He is here to schedule a repeat Colonoscopy.  He denies any GI symptoms such as abdominal pain, constipation, diarrhea, rectal bleeding, or weight loss.  No health concerns today.  Diagnosed with sigmoid colon cancer 12/2018.  Underwent laparoscopic sigmoid colectomy.  Also has a history of sessile serrated colon polyps removed.  Had follow-up colonoscopies by Dr. Dessa 12/2019, 12/2020, 08/2021.  08/2021 last colonoscopy by Dr. Dessa: 2 small 4 mm tubular adenoma polyps removed from ascending colon.  Excellent prep.  Otherwise normal.   COLON SURGERY 2020  Hemicolectomy for stage II colon cancer   COLONOSCOPY 12/16/2018  5 cm invasive colorectal adenomcarcinoma/Repeat 50yr/MUS   COLONOSCOPY 12/23/2019  1 year postop exam, high-grade tubulovillous adenoma with focal high-grade dysplasia of the cecum.   COLONOSCOPY 12/21/2020  Tubulovillous adenoma/Tubular adenoma/Sessile serrated polyp, no high-grade dysplasia/Repeat 6 months   Last CT abdomen pelvis and chest 12/2018: No evidence of metastatic disease.  Incidental diffuse hepatic steatosis, tiny left inguinal hernia containing fat.  Patient has not seen oncology.  Past Medical History:  Diagnosis Date   Benign localized hyperplasia of prostate with urinary obstruction and other lower urinary tract symptoms (LUTS) 04/15/2013   BPH (benign prostatic hyperplasia)    Cancer (HCC)    skin cancer   Cancer of sigmoid colon (HCC) 12/29/2018    T3, N0, lymphovascular invasion.  Margins clear. No loss of MMR proteins.    Coronary artery calcification seen on CT scan 02/12/2023   Calcium scoring CT 02/08/2023 shows calcification left main 311, calcification LAD 1175, calcification circumflex 272, calcification RCA 362, for total score 2120, 90th percentile.  Aspirin, statin recommended.  Myoview negative for ischemia 5/24     Enlarged prostate with lower urinary tract symptoms (LUTS) 04/15/2013   Family history of prostate cancer 04/15/2013   Fuchs' corneal dystrophy 08/21/2018   History of colon cancer    History of degenerative disc disease 07/20/2016   Overview:  Status post C-spine surgery x 2, last in 1996   Hyperlipidemia    Incomplete emptying of bladder 04/15/2013   Meniere disorder, unspecified laterality 01/31/2023   Followed by [facility] ENT     Post-void dribbling 04/15/2013   Pure hypercholesterolemia 09/08/2014   Retention of urine 04/15/2013   Squamous cell carcinoma of skin 01/02/2010   left forearm, excised 01/23/2010, margins free    Past Surgical History:  Procedure Laterality Date   BACK SURGERY  1994   cervical disc   BACK SURGERY  1996   cervical spine surgery   COLON SURGERY  12/16/2018   hemicolectomy   COLONOSCOPY WITH PROPOFOL  N/A 12/16/2018   Procedure: COLONOSCOPY WITH PROPOFOL ;  Surgeon: Gaylyn Gladis PENNER, MD;  Location: [facility] ENDOSCOPY;  Service: Endoscopy;  Laterality: N/A;   COLONOSCOPY WITH PROPOFOL  N/A 12/23/2019   Procedure: COLONOSCOPY WITH PROPOFOL ;  Surgeon: Dessa Reyes ORN, MD;  Location: [facility] ENDOSCOPY;  Service: Endoscopy;  Laterality:  N/A;  Need CEA on arrival   COLONOSCOPY WITH PROPOFOL  N/A 12/21/2020   Procedure: COLONOSCOPY WITH PROPOFOL ;  Surgeon: Dessa Reyes ORN, MD;  Location: [facility] ENDOSCOPY;  Service: Endoscopy;  Laterality: N/A;  1st case   COLONOSCOPY WITH PROPOFOL  N/A 08/30/2021   Procedure: COLONOSCOPY WITH PROPOFOL ;  Surgeon: Dessa Reyes ORN, MD;  Location: [facility]  ENDOSCOPY;  Service: Endoscopy;  Laterality: N/A;   EYE SURGERY Bilateral    Cataract Extraction with IOL   HERNIA REPAIR Right 2015   Inguinal Hernia Repair   LAPAROSCOPIC SIGMOID COLECTOMY N/A 12/29/2018   Procedure: LAPAROSCOPIC SIGMOID COLECTOMY;  Surgeon: Dessa Reyes ORN, MD;  Location: [facility] ORS;  Service: General;  Laterality: N/A;   TRANSURETHRAL RESECTION OF PROSTATE N/A 08/13/2016   Procedure: TRANSURETHRAL RESECTION OF THE PROSTATE (TURP);  Surgeon: Rosina Riis, MD;  Location: [facility] ORS;  Service: Urology;  Laterality: N/A;    Prior to Admission medications   Medication Sig Start Date End Date Taking? Authorizing Provider  aspirin EC 81 MG tablet Take by mouth.    [provider]  bisacodyl (DULCOLAX) 5 MG EC tablet One bottle for colonoscopy prep. Use as directed. 07/19/21   [provider]  diazepam (VALIUM) 5 MG tablet Take 5 mg by mouth every 12 (twelve) hours as needed for anxiety.    [provider]  hydrochlorothiazide (MICROZIDE) 12.5 MG capsule Take 12.5 mg by mouth daily.    [provider]  ibuprofen  (ADVIL ,MOTRIN ) 200 MG tablet Take 1 tablet (200 mg total) by mouth every 4 (four) hours as needed for mild pain. Patient not taking: Reported on 08/30/2021 12/31/18   Dessa Reyes ORN, MD  prednisoLONE  acetate (PRED FORTE ) 1 % ophthalmic suspension Place 1 drop into both eyes daily.    [provider]  rosuvastatin (CRESTOR) 10 MG tablet Take 10 mg by mouth at bedtime.    [provider]    Family History  Problem Relation Age of Onset   Prostate cancer Father    Cancer Father    Diabetes Father    Kidney cancer Neg Hx      Social History   Tobacco Use   Smoking status: Never   Smokeless tobacco: Never  Vaping Use   Vaping status: Never Used  Substance Use Topics   Alcohol  use: Never   Drug use: No    Allergies as of 11/21/2023 - Review Complete 11/21/2023  Allergen Reaction Noted    Oxycodone -acetaminophen   09/07/2014   Ciprofloxacin  Diarrhea 08/27/2016   Alfuzosin  09/03/2014   Bactrim  [sulfamethoxazole -trimethoprim ] Other (See Comments) 08/24/2016   Dutasteride  09/03/2014   Tamsulosin  09/03/2014    Review of Systems:    All systems reviewed and negative except where noted in HPI.   Physical Exam:  BP (!) 144/77   Pulse 60   Temp 97.6 F (36.4 C)   Ht 5' 11.75 (1.822 m)   Wt 185 lb 9.6 oz (84.2 kg)   BMI 25.35 kg/m  No LMP for male patient.  General:   Alert,  Well-developed, well-nourished, pleasant and cooperative in NAD Lungs:  Respirations even and unlabored.  Clear throughout to auscultation.   No wheezes, crackles, or rhonchi. No acute distress. Heart:  Regular rate and rhythm; no murmurs, clicks, rubs, or gallops. Abdomen:  Normal bowel sounds.  No bruits.  Soft, and non-distended without masses, hepatosplenomegaly or hernias noted.  No Tenderness.  No guarding or rebound tenderness.    Neurologic:  Alert and oriented x3;  grossly normal neurologically. Psych:  Alert and cooperative. Normal mood and affect.  Imaging Studies: No results found.  Assessment and Plan:   Darin Bell is a 78 y.o. y/o male has been referred for followup of:  1.  History of sigmoid colon cancer diagnosed 12/2018 (stage II) s/p laparoscopic sigmoid colectomy.  2.  History of adenomatous colon polyps with focal high-grade dysplasia of cecum (12/2019).  Plan: -Scheduling Colonoscopy I discussed risks of colonoscopy with patient to include risk of bleeding, colon perforation, and risk of sedation.  Patient expressed understanding and agrees to proceed with colonoscopy.   Follow up based on colonoscopy results.  Ellouise Console, PA-C

## 2023-11-21 ENCOUNTER — Ambulatory Visit: Payer: PPO | Admitting: Physician Assistant

## 2023-11-21 ENCOUNTER — Encounter: Payer: Self-pay | Admitting: Physician Assistant

## 2023-11-21 VITALS — BP 144/77 | HR 60 | Temp 97.6°F | Ht 71.75 in | Wt 185.6 lb

## 2023-11-21 DIAGNOSIS — Z85038 Personal history of other malignant neoplasm of large intestine: Secondary | ICD-10-CM

## 2023-11-21 DIAGNOSIS — Z08 Encounter for follow-up examination after completed treatment for malignant neoplasm: Secondary | ICD-10-CM

## 2023-11-21 DIAGNOSIS — Z860100 Personal history of colon polyps, unspecified: Secondary | ICD-10-CM

## 2023-11-21 DIAGNOSIS — Z860101 Personal history of adenomatous and serrated colon polyps: Secondary | ICD-10-CM

## 2023-11-21 MED ORDER — PEG 3350-KCL-NA BICARB-NACL 420 G PO SOLR: 4000.0000 mL | Freq: Once | ORAL | 0 refills | Status: AC

## 2023-12-19 ENCOUNTER — Encounter
Admission: RE | Disposition: A | Discharge status: Home / Self Care | Payer: Self-pay | Source: Home / Self Care | Attending: Gastroenterology

## 2023-12-19 ENCOUNTER — Ambulatory Visit: Admitting: Anesthesiology

## 2023-12-19 ENCOUNTER — Encounter: Payer: Self-pay | Admitting: Gastroenterology

## 2023-12-19 ENCOUNTER — Ambulatory Visit
Admission: RE | Admit: 2023-12-19 | Discharge: 2023-12-19 | Disposition: A | Discharge status: Home / Self Care | Payer: PPO | Attending: Gastroenterology | Admitting: Gastroenterology

## 2023-12-19 DIAGNOSIS — K635 Polyp of colon: Secondary | ICD-10-CM

## 2023-12-19 DIAGNOSIS — D124 Benign neoplasm of descending colon: Secondary | ICD-10-CM | POA: Diagnosis not present

## 2023-12-19 DIAGNOSIS — Z1211 Encounter for screening for malignant neoplasm of colon: Secondary | ICD-10-CM | POA: Insufficient documentation

## 2023-12-19 DIAGNOSIS — D122 Benign neoplasm of ascending colon: Secondary | ICD-10-CM | POA: Diagnosis not present

## 2023-12-19 DIAGNOSIS — K649 Unspecified hemorrhoids: Secondary | ICD-10-CM | POA: Diagnosis not present

## 2023-12-19 DIAGNOSIS — K641 Second degree hemorrhoids: Secondary | ICD-10-CM | POA: Diagnosis not present

## 2023-12-19 DIAGNOSIS — Z85038 Personal history of other malignant neoplasm of large intestine: Secondary | ICD-10-CM

## 2023-12-19 DIAGNOSIS — I2510 Atherosclerotic heart disease of native coronary artery without angina pectoris: Secondary | ICD-10-CM | POA: Diagnosis not present

## 2023-12-19 HISTORY — PX: POLYPECTOMY: SHX5525

## 2023-12-19 HISTORY — PX: COLONOSCOPY WITH PROPOFOL: SHX5780

## 2023-12-19 SURGERY — COLONOSCOPY WITH PROPOFOL
Anesthesia: General

## 2023-12-19 MED ORDER — PROPOFOL 500 MG/50ML IV EMUL
INTRAVENOUS | Status: DC | PRN
  Administered 2023-12-19: 120 ug/kg/min via INTRAVENOUS

## 2023-12-19 MED ORDER — LIDOCAINE HCL (PF) 2 % IJ SOLN
INTRAMUSCULAR | Status: AC
  Filled 2023-12-19: qty 5

## 2023-12-19 MED ORDER — SODIUM CHLORIDE 0.9 % IV SOLN: INTRAVENOUS | Status: DC

## 2023-12-19 MED ORDER — PROPOFOL 10 MG/ML IV BOLUS
INTRAVENOUS | Status: DC | PRN
  Administered 2023-12-19: 80 mg via INTRAVENOUS

## 2023-12-19 MED ORDER — DEXMEDETOMIDINE HCL IN NACL 80 MCG/20ML IV SOLN
INTRAVENOUS | Status: AC
  Filled 2023-12-19: qty 20

## 2023-12-19 NOTE — Transfer of Care (Signed)
 Immediate Anesthesia Transfer of Care Note  Patient: Darin Bell  Procedure(s) Performed: COLONOSCOPY WITH PROPOFOL POLYPECTOMY  Patient Location: PACU and Endoscopy Unit  Anesthesia Type:General  Level of Consciousness: drowsy and patient cooperative  Airway & Oxygen Therapy: Patient Spontanous Breathing  Post-op Assessment: Report given to RN and Post -op Vital signs reviewed and stable  Post vital signs: Reviewed and stable  Last Vitals:  Vitals Value Taken Time  BP 96/67 12/19/23 0836  Temp 36.1 C 12/19/23 0836  Pulse 62 12/19/23 0838  Resp 13 12/19/23 0838  SpO2 95 % 12/19/23 0838  Vitals shown include unfiled device data.  Last Pain:  Vitals:   12/19/23 0836  TempSrc: Temporal  PainSc: Asleep         Complications: No notable events documented.

## 2023-12-19 NOTE — Anesthesia Preprocedure Evaluation (Signed)
 Anesthesia Evaluation  Patient identified by MRN, date of birth, ID band Patient awake    Reviewed: Allergy & Precautions, NPO status , Patient's Chart, lab work & pertinent test results  History of Anesthesia Complications Negative for: history of anesthetic complications  Airway Mallampati: I  TM Distance: >3 FB Neck ROM: full    Dental  (+) Dental Advidsory Given, Implants, Teeth Intact   Pulmonary neg pulmonary ROS   Pulmonary exam normal        Cardiovascular Exercise Tolerance: Good (-) hypertension(-) angina + CAD  (-) Past MI and (-) Cardiac Stents Normal cardiovascular exam(-) dysrhythmias (-) Valvular Problems/Murmurs     Neuro/Psych negative neurological ROS  negative psych ROS   GI/Hepatic negative GI ROS, Neg liver ROS,,,  Endo/Other  negative endocrine ROS    Renal/GU negative Renal ROS  negative genitourinary   Musculoskeletal   Abdominal Normal abdominal exam  (+)   Peds  Hematology negative hematology ROS (+)   Anesthesia Other Findings Past Medical History: 04/15/2013: Benign localized hyperplasia of prostate with urinary  obstruction and other lower urinary tract symptoms (LUTS) No date: BPH (benign prostatic hyperplasia) No date: Cancer (HCC)     Comment:  skin cancer 12/29/2018: Cancer of sigmoid colon (HCC)     Comment:  T3, N0, lymphovascular invasion.  Margins clear. No loss              of MMR proteins.  04/15/2013: Enlarged prostate with lower urinary tract symptoms (LUTS) 04/15/2013: Family history of prostate cancer No date: History of colon cancer 07/20/2016: History of degenerative disc disease     Comment:  Overview:  Status post C-spine surgery x 2, last in 1996 No date: Hyperlipidemia 04/15/2013: Incomplete emptying of bladder 04/15/2013: Post-void dribbling 09/08/2014: Pure hypercholesterolemia 04/15/2013: Retention of urine 2011: Squamous cell carcinoma of skin     Comment:  left  forearm  Past Surgical History: 1994: BACK SURGERY     Comment:  cervical disc 1996: BACK SURGERY     Comment:  cervical spine surgery 12/16/2018: COLON SURGERY     Comment:  hemicolectomy 12/16/2018: COLONOSCOPY WITH PROPOFOL; N/A     Comment:  Procedure: COLONOSCOPY WITH PROPOFOL;  Surgeon:               Christena Deem, MD;  Location: [facility] ENDOSCOPY;                Service: Endoscopy;  Laterality: N/A; 12/23/2019: COLONOSCOPY WITH PROPOFOL; N/A     Comment:  Procedure: COLONOSCOPY WITH PROPOFOL;  Surgeon: Earline Mayotte, MD;  Location: [facility] ENDOSCOPY;  Service:               Endoscopy;  Laterality: N/A;  Need CEA on arrival 12/21/2020: COLONOSCOPY WITH PROPOFOL; N/A     Comment:  Procedure: COLONOSCOPY WITH PROPOFOL;  Surgeon: Earline Mayotte, MD;  Location: [facility] ENDOSCOPY;  Service:               Endoscopy;  Laterality: N/A;  1st case No date: EYE SURGERY; Bilateral     Comment:  Cataract Extraction with IOL 2015: HERNIA REPAIR; Right     Comment:  Inguinal Hernia Repair 12/29/2018: LAPAROSCOPIC SIGMOID COLECTOMY; N/A     Comment:  Procedure: LAPAROSCOPIC SIGMOID COLECTOMY;  Surgeon:  Earline Mayotte, MD;  Location: [facility] ORS;  Service:               General;  Laterality: N/A; 08/13/2016: TRANSURETHRAL RESECTION OF PROSTATE; N/A     Comment:  Procedure: TRANSURETHRAL RESECTION OF THE PROSTATE               (TURP);  Surgeon: Vanna Scotland, MD;  Location: [facility]               ORS;  Service: Urology;  Laterality: N/A;     Reproductive/Obstetrics negative OB ROS                             Anesthesia Physical Anesthesia Plan  ASA: 2  Anesthesia Plan: General   Post-op Pain Management:    Induction: Intravenous  PONV Risk Score and Plan: 2 and Propofol infusion and TIVA  Airway Management Planned: Natural Airway and Nasal Cannula  Additional Equipment:   Intra-op Plan:   Post-operative Plan:    Informed Consent: I have reviewed the patients History and Physical, chart, labs and discussed the procedure including the risks, benefits and alternatives for the proposed anesthesia with the patient or authorized representative who has indicated his/her understanding and acceptance.     Dental Advisory Given  Plan Discussed with: Anesthesiologist, CRNA and Surgeon  Anesthesia Plan Comments: (Patient consented for risks of anesthesia including but not limited to:  - adverse reactions to medications - risk of airway placement if required - damage to eyes, teeth, lips or other oral mucosa - nerve damage due to positioning  - sore throat or hoarseness - Damage to heart, brain, nerves, lungs, other parts of body or loss of life  Patient voiced understanding.)        Anesthesia Quick Evaluation

## 2023-12-19 NOTE — Anesthesia Postprocedure Evaluation (Signed)
 Anesthesia Post Note  Patient: Darin Bell  Procedure(s) Performed: COLONOSCOPY WITH PROPOFOL POLYPECTOMY  Patient location during evaluation: Endoscopy Anesthesia Type: General Level of consciousness: awake and alert Pain management: pain level controlled Vital Signs Assessment: post-procedure vital signs reviewed and stable Respiratory status: spontaneous breathing, nonlabored ventilation, respiratory function stable and patient connected to nasal cannula oxygen Cardiovascular status: blood pressure returned to baseline and stable Postop Assessment: no apparent nausea or vomiting Anesthetic complications: no   No notable events documented.   Last Vitals:  Vitals:   12/19/23 0836 12/19/23 0856  BP: 96/67 132/84  Pulse:    Resp: 13   Temp: (!) 36.1 C   SpO2: 95%     Last Pain:  Vitals:   12/19/23 0856  TempSrc:   PainSc: 0-No pain                 Lenard Simmer

## 2023-12-19 NOTE — Op Note (Signed)
 [facility] Gastroenterology Patient Name: Darin Bell Procedure Date: 12/19/2023 8:08 AM MRN: [id number] Account #: [id number] Date of Birth: 08-14-1946 Admit Type: Outpatient Age: 78 Room: [facility] ENDO ROOM 4 Gender: Male Note Status: Finalized Instrument Name: Prentice Docker [id number] Procedure:             Colonoscopy Indications:           High risk colon cancer surveillance: Personal history                         of colon cancer Providers:             Midge Minium MD, MD Referring MD:          Daniel Nones, MD (Referring MD) Medicines:             Propofol per Anesthesia Complications:         No immediate complications. Procedure:             Pre-Anesthesia Assessment:                        - Prior to the procedure, a History and Physical was                         performed, and patient medications and allergies were                         reviewed. The patient's tolerance of previous                         anesthesia was also reviewed. The risks and benefits                         of the procedure and the sedation options and risks                         were discussed with the patient. All questions were                         answered, and informed consent was obtained. Prior                         Anticoagulants: The patient has taken no anticoagulant                         or antiplatelet agents. ASA Grade Assessment: II - A                         patient with mild systemic disease. After reviewing                         the risks and benefits, the patient was deemed in                         satisfactory condition to undergo the procedure.                        After obtaining informed consent, the colonoscope was  passed under direct vision. Throughout the procedure,                         the patient's blood pressure, pulse, and oxygen                         saturations were monitored continuously. The                          Colonoscope was introduced through the anus and                         advanced to the the cecum, identified by appendiceal                         orifice and ileocecal valve. The colonoscopy was                         performed without difficulty. The patient tolerated                         the procedure well. The quality of the bowel                         preparation was excellent. Findings:      The perianal and digital rectal examinations were normal.      Two sessile polyps were found in the ascending colon. The polyps were 2       to 4 mm in size. These polyps were removed with a cold snare. Resection       and retrieval were complete.      A 4 mm polyp was found in the descending colon. The polyp was sessile.       The polyp was removed with a cold snare. Resection and retrieval were       complete.      Non-bleeding internal hemorrhoids were found during retroflexion. The       hemorrhoids were Grade II (internal hemorrhoids that prolapse but reduce       spontaneously). Impression:            - Two 2 to 4 mm polyps in the ascending colon, removed                         with a cold snare. Resected and retrieved.                        - One 4 mm polyp in the descending colon, removed with                         a cold snare. Resected and retrieved.                        - Non-bleeding internal hemorrhoids. Recommendation:        - Discharge patient to home.                        - Resume previous diet.                        -  Continue present medications.                        - Await pathology results.                        - Repeat colonoscopy is not recommended for                         surveillance. Procedure Code(s):     --- Professional ---                        [phone], Colonoscopy, flexible; with removal of                         tumor(s), polyp(s), or other lesion(s) by snare                         technique Diagnosis Code(s):     --- Professional  ---                        X52.841, Personal history of other malignant neoplasm                         of large intestine                        D12.2, Benign neoplasm of ascending colon CPT copyright 2022 [employer]. All rights reserved. The codes documented in this report are preliminary and upon coder review may  be revised to meet current compliance requirements. Midge Minium MD, MD 12/19/2023 8:35:41 AM This report has been signed electronically. Number of Addenda: 0 Note Initiated On: 12/19/2023 8:08 AM Scope Withdrawal Time: 0 hours 10 minutes 23 seconds  Total Procedure Duration: 0 hours 12 minutes 52 seconds  Estimated Blood Loss:  Estimated blood loss: none.      [facility]

## 2023-12-19 NOTE — H&P (Signed)
 Midge Minium, MD [facility] [address] [place], [place] [address] Phone:[phone] Fax : [phone]  Primary Care Physician:  Lynnea Ferrier, MD Primary Gastroenterologist:  Dr. Servando Snare  Pre-Procedure History & Physical: HPI:  Darin Bell is a 78 y.o. male is here for an colonoscopy.   Past Medical History:  Diagnosis Date   Benign localized hyperplasia of prostate with urinary obstruction and other lower urinary tract symptoms (LUTS) 04/15/2013   BPH (benign prostatic hyperplasia)    Cancer (HCC)    skin cancer   Cancer of sigmoid colon (HCC) 12/29/2018   T3, N0, lymphovascular invasion.  Margins clear. No loss of MMR proteins.    Coronary artery calcification seen on CT scan 02/12/2023   Calcium scoring CT 02/08/2023 shows calcification left main 311, calcification LAD 1175, calcification circumflex 272, calcification RCA 362, for total score 2120, 90th percentile.  Aspirin, statin recommended.  Myoview negative for ischemia 5/24     Enlarged prostate with lower urinary tract symptoms (LUTS) 04/15/2013   Family history of prostate cancer 04/15/2013   Fuchs' corneal dystrophy 08/21/2018   History of colon cancer    History of degenerative disc disease 07/20/2016   Overview:  Status post C-spine surgery x 2, last in 1996   Hyperlipidemia    Incomplete emptying of bladder 04/15/2013   Meniere disorder, unspecified laterality 01/31/2023   Followed by [facility] ENT     Post-void dribbling 04/15/2013   Pure hypercholesterolemia 09/08/2014   Retention of urine 04/15/2013   Squamous cell carcinoma of skin 01/02/2010   left forearm, excised 01/23/2010, margins free    Past Surgical History:  Procedure Laterality Date   BACK SURGERY  1994   cervical disc   BACK SURGERY  1996   cervical spine surgery   COLON SURGERY  12/16/2018   hemicolectomy   COLONOSCOPY WITH PROPOFOL N/A 12/16/2018   Procedure: COLONOSCOPY WITH PROPOFOL;  Surgeon: Christena Deem, MD;  Location: [facility]  ENDOSCOPY;  Service: Endoscopy;  Laterality: N/A;   COLONOSCOPY WITH PROPOFOL N/A 12/23/2019   Procedure: COLONOSCOPY WITH PROPOFOL;  Surgeon: Earline Mayotte, MD;  Location: [facility] ENDOSCOPY;  Service: Endoscopy;  Laterality: N/A;  Need CEA on arrival   COLONOSCOPY WITH PROPOFOL N/A 12/21/2020   Procedure: COLONOSCOPY WITH PROPOFOL;  Surgeon: Earline Mayotte, MD;  Location: [facility] ENDOSCOPY;  Service: Endoscopy;  Laterality: N/A;  1st case   COLONOSCOPY WITH PROPOFOL N/A 08/30/2021   Procedure: COLONOSCOPY WITH PROPOFOL;  Surgeon: Earline Mayotte, MD;  Location: [facility] ENDOSCOPY;  Service: Endoscopy;  Laterality: N/A;   EYE SURGERY Bilateral    Cataract Extraction with IOL   HERNIA REPAIR Right 2015   Inguinal Hernia Repair   LAPAROSCOPIC SIGMOID COLECTOMY N/A 12/29/2018   Procedure: LAPAROSCOPIC SIGMOID COLECTOMY;  Surgeon: Earline Mayotte, MD;  Location: [facility] ORS;  Service: General;  Laterality: N/A;   TRANSURETHRAL RESECTION OF PROSTATE N/A 08/13/2016   Procedure: TRANSURETHRAL RESECTION OF THE PROSTATE (TURP);  Surgeon: Vanna Scotland, MD;  Location: [facility] ORS;  Service: Urology;  Laterality: N/A;    Prior to Admission medications   Medication Sig Start Date End Date Taking? Authorizing Provider  aspirin EC 81 MG tablet Take by mouth.   Yes [provider]  hydrochlorothiazide (MICROZIDE) 12.5 MG capsule Take 12.5 mg by mouth daily.   Yes [provider]  rosuvastatin (CRESTOR) 10 MG tablet Take 10 mg by mouth at bedtime.   Yes [provider]  bisacodyl (DULCOLAX) 5 MG  EC tablet One bottle for colonoscopy prep. Use as directed. 07/19/21   [provider]  diazepam (VALIUM) 5 MG tablet Take 5 mg by mouth every 12 (twelve) hours as needed for anxiety.    [provider]  ibuprofen (ADVIL,MOTRIN) 200 MG tablet Take 1 tablet (200 mg total) by mouth every 4 (four) hours as needed for mild pain. 12/31/18   Earline Mayotte, MD  prednisoLONE  acetate (PRED FORTE) 1 % ophthalmic suspension Place 1 drop into both eyes daily.    [provider]    Allergies as of 11/21/2023 - Review Complete 11/21/2023  Allergen Reaction Noted   Oxycodone-acetaminophen  09/07/2014   Ciprofloxacin Diarrhea 08/27/2016   Alfuzosin  09/03/2014   Bactrim [sulfamethoxazole-trimethoprim] Other (See Comments) 08/24/2016   Dutasteride  09/03/2014   Tamsulosin  09/03/2014    Family History  Problem Relation Age of Onset   Prostate cancer Father    Cancer Father    Diabetes Father    Kidney cancer Neg Hx     Social History   Socioeconomic History   Marital status: Single    Spouse name: Not on file   Number of children: 3   Years of education: Not on file   Highest education level: Not on file  Occupational History   Not on file  Tobacco Use   Smoking status: Never   Smokeless tobacco: Never  Vaping Use   Vaping status: Never Used  Substance and Sexual Activity   Alcohol use: Never   Drug use: No   Sexual activity: Not on file  Other Topics Concern   Not on file  Social History Narrative   Jimmye Norman; no alcohol; no smoking. [name]; lives with wife.    Social Drivers of Corporate investment banker Strain: Low Risk  (01/31/2023)   Received from [facility], [facility]   Overall Financial Resource Strain (CARDIA)    Difficulty of Paying Living Expenses: Not hard at all  Food Insecurity: No Food Insecurity (01/31/2023)   [facility], [facility]   Hunger Vital Sign    Worried About Running Out of Food in the Last Year: Never true    Ran Out of Food in the Last Year: Never true  Transportation Needs: No Transportation Needs (01/31/2023)   Received from [facility], [facility]   [facility] - Transportation    In the past 12 months, has lack of transportation kept you from medical appointments or from getting  medications?: No    Lack of Transportation (Non-Medical): No  Physical Activity: Not on file  Stress: Not on file  Social Connections: Not on file  Intimate Partner Violence: Not on file    Review of Systems: See HPI, otherwise negative ROS  Physical Exam: BP 138/83   Pulse 73   Temp (!) 97.2 F (36.2 C) (Temporal)   Resp 18   Wt 79.5 kg   SpO2 98%   BMI 23.93 kg/m  General:   Alert,  pleasant and cooperative in NAD Head:  Normocephalic and atraumatic. Neck:  Supple; no masses or thyromegaly. Lungs:  Clear throughout to auscultation.    Heart:  Regular rate and rhythm. Abdomen:  Soft, nontender and nondistended. Normal bowel sounds, without guarding, and without rebound.   Neurologic:  Alert and  oriented x4;  grossly normal neurologically.  Impression/Plan: Darin Bell is here for an colonoscopy to be performed for  personal history of colon cancer.  Risks, benefits, limitations, and alternatives regarding  colonoscopy have been reviewed with the patient.  Questions have been answered.  All parties agreeable.   Midge Minium, MD  12/19/2023, 8:12 AM

## 2023-12-20 ENCOUNTER — Encounter: Payer: Self-pay | Admitting: Gastroenterology

## 2023-12-20 LAB — SURGICAL PATHOLOGY

## 2024-01-07 ENCOUNTER — Ambulatory Visit: Payer: PPO | Admitting: Dermatology

## 2024-01-30 DIAGNOSIS — E7800 Pure hypercholesterolemia, unspecified: Secondary | ICD-10-CM | POA: Diagnosis not present

## 2024-01-30 DIAGNOSIS — Z85038 Personal history of other malignant neoplasm of large intestine: Secondary | ICD-10-CM | POA: Diagnosis not present

## 2024-01-30 DIAGNOSIS — Z8739 Personal history of other diseases of the musculoskeletal system and connective tissue: Secondary | ICD-10-CM | POA: Diagnosis not present

## 2024-01-30 DIAGNOSIS — Z125 Encounter for screening for malignant neoplasm of prostate: Secondary | ICD-10-CM | POA: Diagnosis not present

## 2024-01-30 DIAGNOSIS — Z0000 Encounter for general adult medical examination without abnormal findings: Secondary | ICD-10-CM | POA: Diagnosis not present

## 2024-02-06 DIAGNOSIS — Z8739 Personal history of other diseases of the musculoskeletal system and connective tissue: Secondary | ICD-10-CM | POA: Diagnosis not present

## 2024-02-06 DIAGNOSIS — R3911 Hesitancy of micturition: Secondary | ICD-10-CM | POA: Diagnosis not present

## 2024-02-06 DIAGNOSIS — Z131 Encounter for screening for diabetes mellitus: Secondary | ICD-10-CM | POA: Diagnosis not present

## 2024-02-06 DIAGNOSIS — Z0000 Encounter for general adult medical examination without abnormal findings: Secondary | ICD-10-CM | POA: Diagnosis not present

## 2024-02-06 DIAGNOSIS — Z125 Encounter for screening for malignant neoplasm of prostate: Secondary | ICD-10-CM | POA: Diagnosis not present

## 2024-02-06 DIAGNOSIS — I2510 Atherosclerotic heart disease of native coronary artery without angina pectoris: Secondary | ICD-10-CM | POA: Diagnosis not present

## 2024-02-06 DIAGNOSIS — Z85038 Personal history of other malignant neoplasm of large intestine: Secondary | ICD-10-CM | POA: Diagnosis not present

## 2024-02-06 DIAGNOSIS — N401 Enlarged prostate with lower urinary tract symptoms: Secondary | ICD-10-CM | POA: Diagnosis not present

## 2024-02-06 DIAGNOSIS — E7800 Pure hypercholesterolemia, unspecified: Secondary | ICD-10-CM | POA: Diagnosis not present

## 2024-04-06 ENCOUNTER — Ambulatory Visit: Admitting: Dermatology

## 2024-04-06 DIAGNOSIS — L814 Other melanin hyperpigmentation: Secondary | ICD-10-CM | POA: Diagnosis not present

## 2024-04-06 DIAGNOSIS — L821 Other seborrheic keratosis: Secondary | ICD-10-CM

## 2024-04-06 DIAGNOSIS — C44612 Basal cell carcinoma of skin of right upper limb, including shoulder: Secondary | ICD-10-CM | POA: Diagnosis not present

## 2024-04-06 DIAGNOSIS — D1801 Hemangioma of skin and subcutaneous tissue: Secondary | ICD-10-CM | POA: Diagnosis not present

## 2024-04-06 DIAGNOSIS — W908XXA Exposure to other nonionizing radiation, initial encounter: Secondary | ICD-10-CM | POA: Diagnosis not present

## 2024-04-06 DIAGNOSIS — D229 Melanocytic nevi, unspecified: Secondary | ICD-10-CM

## 2024-04-06 DIAGNOSIS — D485 Neoplasm of uncertain behavior of skin: Secondary | ICD-10-CM

## 2024-04-06 DIAGNOSIS — Z1283 Encounter for screening for malignant neoplasm of skin: Secondary | ICD-10-CM | POA: Diagnosis not present

## 2024-04-06 DIAGNOSIS — L570 Actinic keratosis: Secondary | ICD-10-CM

## 2024-04-06 DIAGNOSIS — C4491 Basal cell carcinoma of skin, unspecified: Secondary | ICD-10-CM

## 2024-04-06 DIAGNOSIS — C44619 Basal cell carcinoma of skin of left upper limb, including shoulder: Secondary | ICD-10-CM

## 2024-04-06 DIAGNOSIS — L578 Other skin changes due to chronic exposure to nonionizing radiation: Secondary | ICD-10-CM | POA: Diagnosis not present

## 2024-04-06 DIAGNOSIS — D492 Neoplasm of unspecified behavior of bone, soft tissue, and skin: Secondary | ICD-10-CM

## 2024-04-06 DIAGNOSIS — Z85828 Personal history of other malignant neoplasm of skin: Secondary | ICD-10-CM

## 2024-04-06 HISTORY — DX: Basal cell carcinoma of skin, unspecified: C44.91

## 2024-04-06 NOTE — Progress Notes (Signed)
 Follow-Up Visit   Subjective  Darin Bell is a 78 y.o. male who presents for the following: Skin Cancer Screening and Upper Body Skin Exam  The patient presents for Upper Body Skin Exam (UBSE) for skin cancer screening and mole check. The patient has spots, moles and lesions to be evaluated, some may be new or changing. He has a couple of spots on the scalp to check. History of SCC at left forearm. Recheck right post shoulder, patient deferred biopsy last visit.    The following portions of the chart were reviewed this encounter and updated as appropriate: medications, allergies, medical history  Review of Systems:  No other skin or systemic complaints except as noted in HPI or Assessment and Plan.  Objective  Well appearing patient in no apparent distress; mood and affect are within normal limits.  All skin waist up examined. Relevant physical exam findings are noted in the Assessment and Plan.  L parietal scalp x 1, L forearm x 1 (2) Keratotic papules.  L inf vertex x 4, R helix x 2 Pink scaly macules.  Right Posterior Shoulder 0.7 cm pink pearly thin papule  Left Upper Elbow 5mm firm pink pearly papule    Assessment & Plan   HYPERTROPHIC ACTINIC KERATOSIS (2) L parietal scalp x 1, L forearm x 1 (2) Actinic keratoses are precancerous spots that appear secondary to cumulative UV radiation exposure/sun exposure over time. They are chronic with expected duration over 1 year. A portion of actinic keratoses will progress to squamous cell carcinoma of the skin. It is not possible to reliably predict which spots will progress to skin cancer and so treatment is recommended to prevent development of skin cancer.  Recommend daily broad spectrum sunscreen SPF 30+ to sun-exposed areas, reapply every 2 hours as needed.  Recommend staying in the shade or wearing long sleeves, sun glasses (UVA+UVB protection) and wide brim hats (4-inch brim around the entire circumference of the hat). Call  for new or changing lesions. Destruction of lesion - L parietal scalp x 1, L forearm x 1 (2)  Destruction method: cryotherapy   Informed consent: discussed and consent obtained   Lesion destroyed using liquid nitrogen: Yes   Region frozen until ice ball extended beyond lesion: Yes   Outcome: patient tolerated procedure well with no complications   Post-procedure details: wound care instructions given   Additional details:  Prior to procedure, discussed risks of blister formation, small wound, skin dyspigmentation, or rare scar following cryotherapy. Recommend Vaseline ointment to treated areas while healing.  AK (ACTINIC KERATOSIS) L inf vertex x 4, R helix x 2 Actinic keratoses are precancerous spots that appear secondary to cumulative UV radiation exposure/sun exposure over time. They are chronic with expected duration over 1 year. A portion of actinic keratoses will progress to squamous cell carcinoma of the skin. It is not possible to reliably predict which spots will progress to skin cancer and so treatment is recommended to prevent development of skin cancer.  Recommend daily broad spectrum sunscreen SPF 30+ to sun-exposed areas, reapply every 2 hours as needed.  Recommend staying in the shade or wearing long sleeves, sun glasses (UVA+UVB protection) and wide brim hats (4-inch brim around the entire circumference of the hat). Call for new or changing lesions. Destruction of lesion - L inf vertex x 4, R helix x 2  Destruction method: cryotherapy   Informed consent: discussed and consent obtained   Lesion destroyed using liquid nitrogen: Yes   Region  frozen until ice ball extended beyond lesion: Yes   Outcome: patient tolerated procedure well with no complications   Post-procedure details: wound care instructions given   Additional details:  Prior to procedure, discussed risks of blister formation, small wound, skin dyspigmentation, or rare scar following cryotherapy. Recommend Vaseline  ointment to treated areas while healing.  NEOPLASM OF UNCERTAIN BEHAVIOR OF SKIN (2) Right Posterior Shoulder Epidermal / dermal shaving  Lesion diameter (cm):  0.7 Informed consent: discussed and consent obtained   Patient was prepped and draped in usual sterile fashion: Area prepped with alcohol . Anesthesia: the lesion was anesthetized in a standard fashion   Anesthetic:  1% lidocaine  w/ epinephrine  1-100,000 buffered w/ 8.4% NaHCO3 Instrument used: flexible razor blade   Hemostasis achieved with: pressure, aluminum chloride and electrodesiccation   Outcome: patient tolerated procedure well    Destruction of lesion  Destruction method: electrodesiccation and curettage   Informed consent: discussed and consent obtained   Curettage performed in three different directions: Yes   Electrodesiccation performed over the curetted area: Yes   Final wound size (cm):  0.9 Hemostasis achieved with:  pressure, aluminum chloride and electrodesiccation Outcome: patient tolerated procedure well with no complications   Post-procedure details: wound care instructions given   Post-procedure details comment:  Ointment and bandage applied. Specimen 1 - Surgical pathology Differential Diagnosis: r/o BCC Check Margins: No EDC today Left Upper Elbow Epidermal / dermal shaving  Lesion diameter (cm):  0.5 Informed consent: discussed and consent obtained   Patient was prepped and draped in usual sterile fashion: Area prepped with alcohol . Anesthesia: the lesion was anesthetized in a standard fashion   Anesthetic:  1% lidocaine  w/ epinephrine  1-100,000 buffered w/ 8.4% NaHCO3 Instrument used: flexible razor blade   Hemostasis achieved with: pressure, aluminum chloride and electrodesiccation   Outcome: patient tolerated procedure well    Destruction of lesion  Destruction method: electrodesiccation and curettage   Informed consent: discussed and consent obtained   Curettage performed in three  different directions: Yes   Electrodesiccation performed over the curetted area: Yes   Final wound size (cm):  0.7 Hemostasis achieved with:  pressure, aluminum chloride and electrodesiccation Outcome: patient tolerated procedure well with no complications   Post-procedure details: wound care instructions given   Post-procedure details comment:  Ointment and bandage applied. Specimen 2 - Surgical pathology Differential Diagnosis: Dermatofibroma vs BCC Check Margins: No EDC today  Skin cancer screening performed today.  Actinic Damage - Chronic condition, secondary to cumulative UV/sun exposure - diffuse scaly erythematous macules with underlying dyspigmentation - Recommend daily broad spectrum sunscreen SPF 30+ to sun-exposed areas, reapply every 2 hours as needed.  - Staying in the shade or wearing long sleeves, sun glasses (UVA+UVB protection) and wide brim hats (4-inch brim around the entire circumference of the hat) are also recommended for sun protection.  - Call for new or changing lesions.  Lentigines, Seborrheic Keratoses, Hemangiomas - Benign normal skin lesions - Benign-appearing - Call for any changes  Melanocytic Nevi - Tan-brown and/or pink-flesh-colored symmetric macules and papules - Benign appearing on exam today - Observation - Call clinic for new or changing moles - Recommend daily use of broad spectrum spf 30+ sunscreen to sun-exposed areas.   HISTORY OF SQUAMOUS CELL CARCINOMA OF THE SKIN Left forearm, 2011 - No evidence of recurrence today - Recommend regular full body skin exams - Recommend daily broad spectrum sunscreen SPF 30+ to sun-exposed areas, reapply every 2 hours as needed.  - Call if  any new or changing lesions are noted between office visits     Return in about 6 months (around 10/06/2024) for UBSE, Hx AKs, Hx SCC.  IAndrea Kerns, CMA, am acting as scribe for Rexene Rattler, MD .   Documentation: I have reviewed the above documentation  for accuracy and completeness, and I agree with the above.  Rexene Rattler, MD

## 2024-04-06 NOTE — Patient Instructions (Addendum)

## 2024-04-08 LAB — SURGICAL PATHOLOGY

## 2024-04-13 ENCOUNTER — Encounter: Payer: Self-pay | Admitting: Dermatology

## 2024-04-13 ENCOUNTER — Ambulatory Visit: Payer: Self-pay | Admitting: Dermatology

## 2024-04-13 NOTE — Telephone Encounter (Signed)
 Advised pt of bx results/sh ?

## 2024-04-13 NOTE — Telephone Encounter (Signed)
-----   Message from Rexene Rattler sent at 04/13/2024  8:32 AM EDT ----- 1. Skin, right posterior shoulder :       BASAL CELL CARCINOMA, NODULAR PATTERN  2. Skin, left upper elbow :       BASAL CELL CARCINOMA, NODULAR PATTERN  BCC skin cancers- both already treated with EDC at time of biopsy   - please call patient ----- Message ----- From: Interface, Lab In Three Zero One Sent: 04/08/2024   6:54 PM EDT To: Rexene Rattler, MD

## 2024-06-06 DIAGNOSIS — H5712 Ocular pain, left eye: Secondary | ICD-10-CM | POA: Diagnosis not present

## 2024-06-06 DIAGNOSIS — H0100A Unspecified blepharitis right eye, upper and lower eyelids: Secondary | ICD-10-CM | POA: Diagnosis not present

## 2024-06-06 DIAGNOSIS — H0014 Chalazion left upper eyelid: Secondary | ICD-10-CM | POA: Diagnosis not present

## 2024-06-06 DIAGNOSIS — Z947 Corneal transplant status: Secondary | ICD-10-CM | POA: Diagnosis not present

## 2024-07-24 DIAGNOSIS — Z961 Presence of intraocular lens: Secondary | ICD-10-CM | POA: Diagnosis not present

## 2024-07-24 DIAGNOSIS — Z947 Corneal transplant status: Secondary | ICD-10-CM | POA: Diagnosis not present

## 2024-08-12 ENCOUNTER — Other Ambulatory Visit: Payer: Self-pay | Admitting: Internal Medicine

## 2024-08-12 ENCOUNTER — Ambulatory Visit
Admission: RE | Admit: 2024-08-12 | Discharge: 2024-08-12 | Disposition: A | Discharge status: Home / Self Care | Source: Ambulatory Visit | Attending: Internal Medicine | Admitting: Internal Medicine

## 2024-08-12 DIAGNOSIS — M7989 Other specified soft tissue disorders: Secondary | ICD-10-CM

## 2024-08-12 DIAGNOSIS — R600 Localized edema: Secondary | ICD-10-CM | POA: Diagnosis not present

## 2024-08-12 DIAGNOSIS — Z23 Encounter for immunization: Secondary | ICD-10-CM | POA: Diagnosis not present

## 2024-08-12 DIAGNOSIS — I2510 Atherosclerotic heart disease of native coronary artery without angina pectoris: Secondary | ICD-10-CM | POA: Diagnosis not present

## 2024-08-12 DIAGNOSIS — E7800 Pure hypercholesterolemia, unspecified: Secondary | ICD-10-CM | POA: Diagnosis not present

## 2024-10-19 ENCOUNTER — Ambulatory Visit: Admitting: Dermatology

## 2025-01-18 ENCOUNTER — Ambulatory Visit: Admitting: Dermatology
